# Patient Record
Sex: Male | Born: 1985 | Race: White | Hispanic: No | Marital: Married | Smoking: Current every day smoker
Health system: Southern US, Community
[De-identification: ages and names within clinical notes are randomized; demographics above are authoritative.]

## PROBLEM LIST (undated history)

## (undated) DIAGNOSIS — M542 Cervicalgia: Secondary | ICD-10-CM

## (undated) HISTORY — PX: NECK SURGERY: SHX720

## (undated) HISTORY — PX: FRACTURE SURGERY: SHX138

---

## 2004-09-01 ENCOUNTER — Emergency Department: Payer: Self-pay | Admitting: Emergency Medicine

## 2004-09-02 ENCOUNTER — Other Ambulatory Visit: Payer: Self-pay

## 2005-02-28 ENCOUNTER — Inpatient Hospital Stay: Payer: Self-pay | Admitting: Internal Medicine

## 2005-04-03 ENCOUNTER — Ambulatory Visit: Payer: Self-pay | Admitting: Emergency Medicine

## 2008-03-14 ENCOUNTER — Emergency Department: Payer: Self-pay | Admitting: Emergency Medicine

## 2009-06-04 ENCOUNTER — Inpatient Hospital Stay: Payer: Self-pay | Admitting: Surgery

## 2016-09-20 ENCOUNTER — Emergency Department
Admission: EM | Admit: 2016-09-20 | Discharge: 2016-09-20 | Disposition: A | Discharge status: Home / Self Care | Payer: Self-pay | Attending: Emergency Medicine | Admitting: Emergency Medicine

## 2016-09-20 ENCOUNTER — Encounter: Payer: Self-pay | Admitting: *Deleted

## 2016-09-20 DIAGNOSIS — Z79899 Other long term (current) drug therapy: Secondary | ICD-10-CM | POA: Insufficient documentation

## 2016-09-20 DIAGNOSIS — L0291 Cutaneous abscess, unspecified: Secondary | ICD-10-CM

## 2016-09-20 DIAGNOSIS — L0201 Cutaneous abscess of face: Secondary | ICD-10-CM | POA: Insufficient documentation

## 2016-09-20 MED ORDER — LIDOCAINE HCL (PF) 1 % IJ SOLN
INTRAMUSCULAR | Status: AC
  Filled 2016-09-20: qty 5

## 2016-09-20 MED ORDER — SULFAMETHOXAZOLE-TRIMETHOPRIM 800-160 MG PO TABS
1.0000 | ORAL_TABLET | Freq: Once | ORAL | Status: AC
  Administered 2016-09-20: 1 via ORAL
  Filled 2016-09-20: qty 1

## 2016-09-20 MED ORDER — SULFAMETHOXAZOLE-TRIMETHOPRIM 800-160 MG PO TABS: 1.0000 | ORAL_TABLET | Freq: Two times a day (BID) | ORAL | 0 refills | Status: AC

## 2016-09-20 NOTE — ED Notes (Signed)
See triage note   States he has had a "pimple" to right side of face for couple of months  Over the past couple of weeks area is more painful and swollen

## 2016-09-20 NOTE — ED Triage Notes (Signed)
States he had a pimple on the right side of his face that he picked at and now there is a large cyst and swollen area on the right side of his face

## 2016-09-20 NOTE — ED Provider Notes (Signed)
[facility] Emergency Department Provider Note  ____________________________________________  Time seen: Approximately 5:16 PM  I have reviewed the triage vital signs and the nursing notes.   HISTORY  Chief Complaint Facial Pain    HPI Calvin Wu is a 31 y.o. male that presents to the emergency department with swelling and "mass" on the right side of face. Patient states that he has gotten many pimples over the past couple of months on his right cheek, which he picks, which causes them to swell but eventually improve on their own. This one did not improve on its own and has continued to get bigger. Now the right side of his cheek is swelling. Patient states that it is draining clear and bloody fluid. Patient has not taken anything for symptoms. Denies fever, difficulty eating, difficulty swallowing, nausea, vomiting, abdominal pain.   History reviewed. No pertinent past medical history.  There are no active problems to display for this patient.   No past surgical history on file.  Prior to Admission medications   Medication Sig Start Date End Date Taking? Authorizing Provider  buprenorphine-naloxone (SUBOXONE) 8-2 MG SUBL SL tablet Place 1 tablet under the tongue daily.   Yes Historical Provider, MD  sulfamethoxazole-trimethoprim (BACTRIM DS,SEPTRA DS) 800-160 MG tablet Take 1 tablet by mouth 2 (two) times daily. 09/20/16 09/30/16  Enid DerryAshley Katlyn Muldrew, PA-C    Allergies Patient has no known allergies.  History reviewed. No pertinent family history.  Social History Social History  Substance Use Topics  . Smoking status: Not on file  . Smokeless tobacco: Not on file  . Alcohol use Not on file     Review of Systems  Constitutional: No fever/chills Eyes: No visual changes. No discharge. ENT: Negative for congestion and rhinorrhea. Cardiovascular: No chest pain. Respiratory: Negative for cough. No SOB. Gastrointestinal: No abdominal pain.  No nausea,  no vomiting.  No diarrhea.  No constipation. Musculoskeletal: Negative for musculoskeletal pain. Skin: Negative for abrasions, lacerations, ecchymosis. Neurological: Negative for headaches.   ____________________________________________   PHYSICAL EXAM:  VITAL SIGNS: ED Triage Vitals  Enc Vitals Group     BP 09/20/16 1602 119/77     Pulse Rate 09/20/16 1602 72     Resp 09/20/16 1602 18     Temp 09/20/16 1602 98.2 F (36.8 C)     Temp Source 09/20/16 1602 Oral     SpO2 09/20/16 1602 98 %     Weight 09/20/16 1603 195 lb (88.5 kg)     Height 09/20/16 1603 5\' 4"  (1.626 m)     Head Circumference --      Peak Flow --      Pain Score 09/20/16 1603 5     Pain Loc --      Pain Edu? --      Excl. in GC? --      Constitutional: Alert and oriented. Well appearing and in no acute distress. Eyes: Conjunctivae are normal. PERRL. EOMI. No discharge. Head: Atraumatic. ENT:      Ears: Tympanic membranes pearly gray with good landmarks. No discharge.      Nose: No congestion/rhinnorhea.      Mouth/Throat: Mucous membranes are moist. Oropharynx non-erythematous. Tonsils not enlarged. No exudates. Uvula midline. Neck: No stridor.   Hematological/Lymphatic/Immunilogical: No cervical lymphadenopathy. Cardiovascular: Normal rate, regular rhythm. Good peripheral circulation. Respiratory: Normal respiratory effort without tachypnea or retractions. Lungs CTAB. Good air entry to the bases with no decreased or absent breath sounds. Musculoskeletal: Full range of motion  to all extremities. No gross deformities appreciated. Neurologic:  Normal speech and language. No gross focal neurologic deficits are appreciated.  Skin:  Skin is warm, dry. Multiple pimples and scabs over right cheek. 1 cm area of fluctuance over lower right cheek. Tenderness to palpation. No drainage present.  Psychiatric: Mood and affect are normal. Speech and behavior are normal.     ____________________________________________   LABS (all labs ordered are listed, but only abnormal results are displayed)  Labs Reviewed - No data to display ____________________________________________  EKG   ____________________________________________  RADIOLOGY   No results found.  ____________________________________________    PROCEDURES  Procedure(s) performed:    Procedures  INCISION AND DRAINAGE Performed by: Enid Derry Consent: Verbal consent obtained. Risks and benefits: risks, benefits and alternatives were discussed Type: abscess  Body area: right cheek  Anesthesia: local infiltration  Incision was made with a scalpel.  Local anesthetic: lidocaine 1% without epinephrine  Anesthetic total: 2 ml  Complexity: complex Blunt dissection to break up loculations  Drainage: purulent  Drainage amount: 2 cc  Packing material: none  Patient tolerance: Patient tolerated the procedure well with no immediate complications.     Medications  sulfamethoxazole-trimethoprim (BACTRIM DS,SEPTRA DS) 800-160 MG per tablet 1 tablet (1 tablet Oral Given 09/20/16 1729)     ____________________________________________   INITIAL IMPRESSION / ASSESSMENT AND PLAN / ED COURSE  Pertinent labs & imaging results that were available during my care of the patient were reviewed by me and considered in my medical decision making (see chart for details).  Review of the [place] CSRS was performed in accordance of the [facility] prior to dispensing any controlled drugs.  Clinical Course     Patient's diagnosis is consistent with facial abscess. Abscess was I&D in ED. Patient will be discharged home with prescriptions for Bactrim. Patient is to follow up with PCP as needed or otherwise directed. Patient is given ED precautions to return to the ED for any worsening or new symptoms.     ____________________________________________  FINAL CLINICAL IMPRESSION(S) / ED  DIAGNOSES  Final diagnoses:  Abscess      NEW MEDICATIONS STARTED DURING THIS VISIT:  Discharge Medication List as of 09/20/2016  5:21 PM    START taking these medications   Details  sulfamethoxazole-trimethoprim (BACTRIM DS,SEPTRA DS) 800-160 MG tablet Take 1 tablet by mouth 2 (two) times daily., Starting Mon 09/20/2016, Until Thu 09/30/2016, Print            This chart was dictated using voice recognition software/Dragon. Despite best efforts to proofread, errors can occur which can change the meaning. Any change was purely unintentional.    Enid Derry, PA-C 09/21/16 1519    Sharman Cheek, MD 09/23/16 2009

## 2016-12-20 ENCOUNTER — Emergency Department: Payer: Self-pay

## 2016-12-20 ENCOUNTER — Encounter: Payer: Self-pay | Admitting: *Deleted

## 2016-12-20 ENCOUNTER — Inpatient Hospital Stay
Admission: EM | Admit: 2016-12-20 | Discharge: 2016-12-23 | DRG: 603 | Disposition: A | Discharge status: Home / Self Care | Payer: Self-pay | Attending: Specialist | Admitting: Specialist

## 2016-12-20 DIAGNOSIS — Z905 Acquired absence of kidney: Secondary | ICD-10-CM

## 2016-12-20 DIAGNOSIS — L02413 Cutaneous abscess of right upper limb: Secondary | ICD-10-CM

## 2016-12-20 DIAGNOSIS — Z85528 Personal history of other malignant neoplasm of kidney: Secondary | ICD-10-CM

## 2016-12-20 DIAGNOSIS — F1910 Other psychoactive substance abuse, uncomplicated: Secondary | ICD-10-CM | POA: Diagnosis present

## 2016-12-20 DIAGNOSIS — L0390 Cellulitis, unspecified: Secondary | ICD-10-CM | POA: Diagnosis present

## 2016-12-20 DIAGNOSIS — L03221 Cellulitis of neck: Secondary | ICD-10-CM | POA: Diagnosis present

## 2016-12-20 DIAGNOSIS — L03113 Cellulitis of right upper limb: Principal | ICD-10-CM | POA: Diagnosis present

## 2016-12-20 DIAGNOSIS — F17210 Nicotine dependence, cigarettes, uncomplicated: Secondary | ICD-10-CM | POA: Diagnosis present

## 2016-12-20 LAB — CBC
HCT: 43.2 % (ref 40.0–52.0)
Hemoglobin: 14.8 g/dL (ref 13.0–18.0)
MCH: 29.5 pg (ref 26.0–34.0)
MCHC: 34.2 g/dL (ref 32.0–36.0)
MCV: 86.4 fL (ref 80.0–100.0)
Platelets: 301 10*3/uL (ref 150–440)
RBC: 5.01 MIL/uL (ref 4.40–5.90)
RDW: 12.6 % (ref 11.5–14.5)
WBC: 14.8 10*3/uL — AB (ref 3.8–10.6)

## 2016-12-20 LAB — COMPREHENSIVE METABOLIC PANEL
ALBUMIN: 4 g/dL (ref 3.5–5.0)
ALT: 25 U/L (ref 17–63)
AST: 28 U/L (ref 15–41)
Alkaline Phosphatase: 76 U/L (ref 38–126)
Anion gap: 9 (ref 5–15)
BUN: 14 mg/dL (ref 6–20)
CHLORIDE: 99 mmol/L — AB (ref 101–111)
CO2: 27 mmol/L (ref 22–32)
Calcium: 9.2 mg/dL (ref 8.9–10.3)
Creatinine, Ser: 0.91 mg/dL (ref 0.61–1.24)
GFR calc Af Amer: >60 mL/min (ref >60)
GLUCOSE: 95 mg/dL (ref 65–99)
POTASSIUM: 3.9 mmol/L (ref 3.5–5.1)
SODIUM: 135 mmol/L (ref 135–145)
Total Bilirubin: 0.6 mg/dL (ref 0.3–1.2)
Total Protein: 8 g/dL (ref 6.5–8.1)

## 2016-12-20 LAB — LACTIC ACID, PLASMA: LACTIC ACID, VENOUS: 0.8 mmol/L (ref 0.5–1.9)

## 2016-12-20 MED ORDER — SENNOSIDES-DOCUSATE SODIUM 8.6-50 MG PO TABS: 1.0000 | ORAL_TABLET | Freq: Every evening | ORAL | Status: DC | PRN

## 2016-12-20 MED ORDER — VANCOMYCIN HCL 10 G IV SOLR
1250.0000 mg | Freq: Two times a day (BID) | INTRAVENOUS | Status: DC
  Administered 2016-12-20 – 2016-12-22 (×4): 1250 mg via INTRAVENOUS
  Filled 2016-12-20 (×7): qty 1250

## 2016-12-20 MED ORDER — ONDANSETRON HCL 4 MG PO TABS: 4.0000 mg | ORAL_TABLET | Freq: Four times a day (QID) | ORAL | Status: DC | PRN

## 2016-12-20 MED ORDER — VANCOMYCIN HCL 10 G IV SOLR: 1250.0000 mg | Freq: Two times a day (BID) | INTRAVENOUS | Status: DC

## 2016-12-20 MED ORDER — SODIUM CHLORIDE 0.9 % IV SOLN
INTRAVENOUS | Status: DC
  Administered 2016-12-20 – 2016-12-21 (×2): via INTRAVENOUS

## 2016-12-20 MED ORDER — ENOXAPARIN SODIUM 40 MG/0.4ML ~~LOC~~ SOLN
40.0000 mg | SUBCUTANEOUS | Status: DC
  Administered 2016-12-21 – 2016-12-22 (×3): 40 mg via SUBCUTANEOUS
  Filled 2016-12-20 (×4): qty 0.4

## 2016-12-20 MED ORDER — NICOTINE 21 MG/24HR TD PT24
21.0000 mg | MEDICATED_PATCH | Freq: Every day | TRANSDERMAL | Status: DC
  Administered 2016-12-20 – 2016-12-23 (×4): 21 mg via TRANSDERMAL
  Filled 2016-12-20 (×4): qty 1

## 2016-12-20 MED ORDER — VANCOMYCIN HCL IN DEXTROSE 1-5 GM/200ML-% IV SOLN
1000.0000 mg | Freq: Once | INTRAVENOUS | Status: AC
  Administered 2016-12-20: 1000 mg via INTRAVENOUS
  Filled 2016-12-20: qty 200

## 2016-12-20 MED ORDER — BISACODYL 5 MG PO TBEC: 5.0000 mg | DELAYED_RELEASE_TABLET | Freq: Every day | ORAL | Status: DC | PRN

## 2016-12-20 MED ORDER — HYDROCODONE-ACETAMINOPHEN 5-325 MG PO TABS
1.0000 | ORAL_TABLET | ORAL | Status: DC | PRN
  Administered 2016-12-20 – 2016-12-21 (×5): 2 via ORAL
  Filled 2016-12-20 (×6): qty 2

## 2016-12-20 MED ORDER — BUPRENORPHINE HCL-NALOXONE HCL 8-2 MG SL SUBL
1.0000 | SUBLINGUAL_TABLET | Freq: Two times a day (BID) | SUBLINGUAL | Status: DC
  Administered 2016-12-20 – 2016-12-23 (×6): 1 via SUBLINGUAL
  Filled 2016-12-20 (×6): qty 1

## 2016-12-20 MED ORDER — ACETAMINOPHEN 650 MG RE SUPP: 650.0000 mg | Freq: Four times a day (QID) | RECTAL | Status: DC | PRN

## 2016-12-20 MED ORDER — BUPRENORPHINE HCL-NALOXONE HCL 8-2 MG SL SUBL: 1.0000 | SUBLINGUAL_TABLET | Freq: Once | SUBLINGUAL | Status: DC

## 2016-12-20 MED ORDER — ACETAMINOPHEN 325 MG PO TABS: 650.0000 mg | ORAL_TABLET | Freq: Four times a day (QID) | ORAL | Status: DC | PRN

## 2016-12-20 MED ORDER — BUPRENORPHINE HCL 2 MG SL SUBL
2.0000 mg | SUBLINGUAL_TABLET | Freq: Every day | SUBLINGUAL | Status: DC
  Administered 2016-12-20: 2 mg via SUBLINGUAL
  Filled 2016-12-20: qty 1

## 2016-12-20 MED ORDER — ONDANSETRON HCL 4 MG/2ML IJ SOLN: 4.0000 mg | Freq: Four times a day (QID) | INTRAMUSCULAR | Status: DC | PRN

## 2016-12-20 NOTE — Consult Note (Signed)
Date of Consultation:  12/20/2016  Requesting Physician:  Jene Every, MD  Reason for Consultation:  Right upper extremity cellulitis  History of Present Illness: Calvin Wu is a 31 y.o. male who presents with a 2 day history of worsening pain and swelling over the right AC fossa.  Patient reports that he used cocaine and meth 2 days ago using his right before meals fossa for injection as well as right neck and since then he started developing worsening redness swelling and pain particularly over the right before meals fossa and only slightly over the right lower neck. She denies having any fevers at home or chills. Denies any drainage from either area. Denies any chest pain or shortness of breath. He reports decreased range of motion at the right elbow joint due to the pain and swelling but otherwise no decreased range of motion over the right neck or shoulder. He presents today to emergency room for further evaluation.  Past Medical History: --IV drug abuse --Prior traumas in 2006, 2007 --Left renal cell carcinoma  Past Surgical History: --Appendectomy --Left partial nephrectomy  Home Medications: Prior to Admission medications   Medication Sig Start Date End Date Taking? Authorizing Provider  buprenorphine-naloxone (SUBOXONE) 8-2 MG SUBL SL tablet Place 1 tablet under the tongue daily.    Historical Provider, MD    Allergies: No Known Allergies  Social History:  reports that he smokes 1 ppd.  He has never used smokeless tobacco. He reports that he drinks alcohol. He reports that he uses drugs, including Cocaine, IV, and Methamphetamines.   Family History: HTN on father's side  Review of Systems: Review of Systems  Constitutional: Negative for chills and fever.  HENT: Negative for hearing loss.   Eyes: Negative for blurred vision.  Respiratory: Negative for cough and shortness of breath.   Cardiovascular: Negative for chest pain and leg swelling.  Gastrointestinal:  Negative for abdominal pain, nausea and vomiting.  Genitourinary: Negative for dysuria.  Musculoskeletal:       Swelling and tenderness of the right elbow area  Skin: Negative for rash.  Neurological: Negative for dizziness.  Psychiatric/Behavioral: Negative for depression.    Physical Exam BP 133/84 (BP Location: Left Arm)   Pulse 73   Temp 99.1 F (37.3 C) (Oral)   Resp 18   Ht  (1.626 m)   Wt 86.2 kg (190 lb)   SpO2 94%   BMI 32.61 kg/m  CONSTITUTIONAL: No acute distress HEENT:  Normocephalic, atraumatic, extraocular motion intact. NECK: Trachea is midline, and there is no jugular venous distension. RESPIRATORY:  Lungs are clear, and breath sounds are equal bilaterally. Normal respiratory effort without pathologic use of accessory muscles. CARDIOVASCULAR: Heart is regular without murmurs, gallops, or rubs. GI: The abdomen is soft, non-distended, non-tender to palpation. Low midline incision well healed. MUSCULOSKELETAL:  Right upper extremity with decreased range of motion at the elbow joint due to pain and swelling.  Otherwise normal strength and range of motion throughout. SKIN:  Right AC fossa with mild erythema and induration, with no palpable area of fluctuance.  AC fossa area is tender to palpation.  No active drainage.   NEUROLOGIC:  Motor and sensation is grossly normal.  Cranial nerves are grossly intact. PSYCH:  Alert and oriented to person, place and time. Affect is normal.  Laboratory Analysis: Results for orders placed or performed during the hospital encounter of 12/20/16 (from the past 24 hour(s))  CBC     Status: Abnormal   Collection  Time: 12/20/16 10:44 AM  Result Value Ref Range   WBC 14.8 (H) 3.8 - 10.6 K/uL   RBC 5.01 4.40 - 5.90 MIL/uL   Hemoglobin 14.8 13.0 - 18.0 g/dL   HCT 09.8 11.9 - 14.7 %   MCV 86.4 80.0 - 100.0 fL   MCH 29.5 26.0 - 34.0 pg   MCHC 34.2 32.0 - 36.0 g/dL   RDW 82.9 56.2 - 13.0 %   Platelets 301 150 - 440 K/uL   Comprehensive metabolic panel     Status: Abnormal   Collection Time: 12/20/16 10:44 AM  Result Value Ref Range   Sodium 135 135 - 145 mmol/L   Potassium 3.9 3.5 - 5.1 mmol/L   Chloride 99 (L) 101 - 111 mmol/L   CO2 27 22 - 32 mmol/L   Glucose, Bld 95 65 - 99 mg/dL   BUN 14 6 - 20 mg/dL   Creatinine, Ser 8.65 0.61 - 1.24 mg/dL   Calcium 9.2 8.9 - 78.4 mg/dL   Total Protein 8.0 6.5 - 8.1 g/dL   Albumin 4.0 3.5 - 5.0 g/dL   AST 28 15 - 41 U/L   ALT 25 17 - 63 U/L   Alkaline Phosphatase 76 38 - 126 U/L   Total Bilirubin 0.6 0.3 - 1.2 mg/dL   GFR calc non Af Amer >60 >60 mL/min   GFR calc Af Amer >60 >60 mL/min   Anion gap 9 5 - 15  Lactic acid, plasma     Status: None   Collection Time: 12/20/16 10:44 AM  Result Value Ref Range   Lactic Acid, Venous 0.8 0.5 - 1.9 mmol/L    Imaging: [place] Extrem Up Right Ltd  Result Date: 12/20/2016 CLINICAL DATA:  IV drug use, antecubital abscess, redness, swelling, painful for 2 days EXAM: ULTRASOUND RIGHT UPPER EXTREMITY LIMITED TECHNIQUE: Ultrasound examination of the upper extremity soft tissues was performed in the area of clinical concern. COMPARISON:  None FINDINGS: 4 x 3.1 x 2.8 cm complex heterogeneous soft tissue abnormality with increased Doppler flow in the antecubital fossa most concerning for a phlegmon or cellulitis. Heterogeneous hypoechoic avascular area located centrally within the area of hypervascularity measuring 9 x 7 mm concerning for a small abscess. No other fluid collection or hematoma. IMPRESSION: 1. 4 x 3.1 x 2.8 cm complex heterogeneous soft tissue abnormality with increased Doppler flow in the antecubital fossa most concerning for a phlegmon or cellulitis. Heterogeneous hypoechoic avascular area located centrally within the area of hypervascularity measuring 9 x 7 mm concerning for a small abscess. Electronically Signed   By: Elige Ko   On: 12/20/2016 10:58    Assessment and Plan: This is a 31 y.o. male who presents with  right AC fossa cellulitis, with a very small abscess in the center of the cellulitis area.  I have personally viewed the patient's imaging study as well as reviewed the patient's laboratory studies and discussed them with the patient. Overall he has an area of cellulitis and induration in the right before meals fossa with a very small abscess measuring 9 x 7 mm. His white blood cell count is 14.8 but otherwise rest of his labs are overall normal.  Discussed with the patient that currently the best treatment option would be to start with IV antibiotics to treat the cellulitis and induration in that this point given the abscess is very small, most likely IV antibiotics will be able to treat it. There is a chance that the induration consolidates into  a bigger abscess in which case he would require drainage procedure. The mild erythema around the right lower neck is small enough that this will also resolve with the IV antibiotics. At this point this is nonoperative management.  Recommend IV antibiotics otherwise can have a regular diet and appropriate pain control. We'll continue following along. The area of erythema has been marked around the [facility] fossa.   Howie Ill, MD [facility]

## 2016-12-20 NOTE — ED Notes (Signed)
Admitting MD came out and found this Rn stating that pt appears red in the face after vancomycin administration. Antibiotic was finished. Pt alert oriented, no distress noted, appears red on cheeks. Will continue to monitor.

## 2016-12-20 NOTE — ED Notes (Signed)
Patient transported to Ultrasound prior to this RN assessment.

## 2016-12-20 NOTE — ED Triage Notes (Signed)
States right arm swelling that began 2 days ago, states he is an IV drug user and shot up in his right arm two days ago, also states right neck pain and swelling, states he shots up in his neck as well

## 2016-12-20 NOTE — ED Notes (Signed)
Pt asleep on stretcher, hooked up to monitor and IV antibiotics.

## 2016-12-20 NOTE — H&P (Signed)
[facility] [place] at [facility]   PATIENT NAME: Calvin Wu    MR#:  [id number]  DATE OF BIRTH:  [birth year]  DATE OF ADMISSION:  12/20/2016  PRIMARY CARE PHYSICIAN: No PCP Per Patient   REQUESTING/REFERRING PHYSICIAN: dr Cyril Loosen  CHIEF COMPLAINT:   Right arm pain HISTORY OF PRESENT ILLNESS:  Calvin Wu  is a 31 y.o. male with a known history of IV drug abuse and tobacco dependence who presents above complaint. Patient reports that he injected IV cocaine 2 days ago into the right anterior cubital fossa. She shortly after that he noticed redness and pain of the right arm. He presents today because the redness and pain has increased. He also states he injected IV cocaine in his neck and he has some pain in the right neck as well.  He received vancomycin in the emergency room and when I went to see him patient's face was flushed. By that time vancomycin was already completed.  Surgical consultation was requested by ED physician. At this time patient does not have an abscess to be drained as per surgery consultation.  PAST MEDICAL HISTORY:  IV drug abuse Tobacco abuse  PAST SURGICAL HISTORY:  None  SOCIAL HISTORY:   Social History  Substance Use Topics  . Smoking status: Current Every Day Smoker  . Smokeless tobacco: Never Used  . Alcohol use Yes    FAMILY HISTORY:  No history of CAD or CVA   DRUG ALLERGIES:  No Known Allergies  REVIEW OF SYSTEMS:   Review of Systems  Constitutional: Negative.  Negative for chills, fever and malaise/fatigue.  HENT: Negative.  Negative for ear discharge, ear pain, hearing loss, nosebleeds and sore throat.   Eyes: Negative.  Negative for blurred vision and pain.  Respiratory: Negative.  Negative for cough, hemoptysis, shortness of breath and wheezing.   Cardiovascular: Negative.  Negative for chest pain, palpitations and leg swelling.  Gastrointestinal: Negative.  Negative for abdominal pain, blood in stool,  diarrhea, nausea and vomiting.  Genitourinary: Negative.  Negative for dysuria.  Musculoskeletal: Negative.  Negative for back pain.       Right arm pain and swelling  Skin: Negative.        Right arm cellulitis Neck pain and redness  Neurological: Negative for dizziness, tremors, speech change, focal weakness, seizures and headaches.  Endo/Heme/Allergies: Negative.  Does not bruise/bleed easily.  Psychiatric/Behavioral: Negative.  Negative for depression, hallucinations and suicidal ideas.    MEDICATIONS AT HOME:   Prior to Admission medications   Medication Sig Start Date End Date Taking? Authorizing Provider  SUBOXONE 8-2 MG FILM Place 1 Film under the tongue 2 (two) times daily. 12/19/16  Yes Historical Provider, MD      VITAL SIGNS:  Blood pressure (!) 139/92, pulse 81, temperature 99.1 F (37.3 C), temperature source Oral, resp. rate 18, height  (1.626 m), weight 86.2 kg (190 lb), SpO2 94 %.  PHYSICAL EXAMINATION:   Physical Exam  Constitutional: He is oriented to person, place, and time and well-developed, well-nourished, and in no distress. No distress.  HENT:  Head: Normocephalic.  Eyes: No scleral icterus.  Neck: Normal range of motion. Neck supple. No JVD present. No tracheal deviation present.  Cardiovascular: Normal rate, regular rhythm and normal heart sounds.  Exam reveals no gallop and no friction rub.   No murmur heard. Pulmonary/Chest: Effort normal and breath sounds normal. No respiratory distress. He has no wheezes. He has no rales. He exhibits no tenderness.  Abdominal: Soft. Bowel sounds are normal. He exhibits no distension and no mass. There is no tenderness. There is no rebound and no guarding.  Musculoskeletal: Normal range of motion. He exhibits no edema.  Neurological: He is alert and oriented to person, place, and time.  Skin: Skin is warm. No rash noted. No erythema.  Face is flushed Multiple tattoos Cellulitis on neck and right arm   Psychiatric: Affect and judgment normal.      LABORATORY PANEL:   CBC  Recent Labs Lab 12/20/16 1044  WBC 14.8*  HGB 14.8  HCT 43.2  PLT 301   ------------------------------------------------------------------------------------------------------------------  Chemistries   Recent Labs Lab 12/20/16 1044  NA 135  K 3.9  CL 99*  CO2 27  GLUCOSE 95  BUN 14  CREATININE 0.91  CALCIUM 9.2  AST 28  ALT 25  ALKPHOS 76  BILITOT 0.6   ------------------------------------------------------------------------------------------------------------------  Cardiac Enzymes No results for input(s): TROPONINI in the last 168 hours. ------------------------------------------------------------------------------------------------------------------  RADIOLOGY:  [place] Extrem Up Right Ltd  Result Date: 12/20/2016 CLINICAL DATA:  IV drug use, antecubital abscess, redness, swelling, painful for 2 days EXAM: ULTRASOUND RIGHT UPPER EXTREMITY LIMITED TECHNIQUE: Ultrasound examination of the upper extremity soft tissues was performed in the area of clinical concern. COMPARISON:  None FINDINGS: 4 x 3.1 x 2.8 cm complex heterogeneous soft tissue abnormality with increased Doppler flow in the antecubital fossa most concerning for a phlegmon or cellulitis. Heterogeneous hypoechoic avascular area located centrally within the area of hypervascularity measuring 9 x 7 mm concerning for a small abscess. No other fluid collection or hematoma. IMPRESSION: 1. 4 x 3.1 x 2.8 cm complex heterogeneous soft tissue abnormality with increased Doppler flow in the antecubital fossa most concerning for a phlegmon or cellulitis. Heterogeneous hypoechoic avascular area located centrally within the area of hypervascularity measuring 9 x 7 mm concerning for a small abscess. Electronically Signed   By: Elige Ko   On: 12/20/2016 10:58    EKG:   none  IMPRESSION AND PLAN:   31 year old male with a history of IV drug abuse  who presents with right arm and right neck cellulitis  1. Right arm and neck cellulitis without abscess at this time Continue vancomycin at a slower rate. Follow up on blood cultures Surgery consultation to follow  2. IV drug abuse: Patient was counseled to stop illicit drugs.  3. Tobacco dependence: Patient is encouraged to quit smoking. Counseling was provided for 4 minutes.  4. Leukocytosis due to problem #1 CBC in a.m.   All the records are reviewed and case discussed with ED provider. Management plans discussed with the patient and he is in agreement  CODE STATUS: full  TOTAL TIME TAKING CARE OF THIS PATIENT: 40 minutes.    Carvel Huskins M.D on 12/20/2016 at 1:49 PM  Between 7am to 6pm - Pager - [phone]  After 6pm go to [email or web address] - Social research officer, government  [facility]  Office  [phone]  CC: Primary care physician; No PCP Per Patient   '

## 2016-12-20 NOTE — Progress Notes (Addendum)
Pharmacy Antibiotic Note  Calvin Wu is a 31 y.o. male admitted on 12/20/2016 with cellulitis.  Pharmacy has been consulted for vancomycin dosing.  Plan: Given history of IVDU several days prior to admission, will target higher vancomycin trough of 15-20 mcg/mL until bacteremia is ruled out.  Patient received vancomycin 1000 mg dose in ED. Will follow with vancomycin 1250 mg IV q12h (6 hour stacked dosing). VT ordered for 4/18 at 1730 which is prior to 5th dose and should represent steady state.  Kinetics: Using adjusted body weight = 69 kg ke: 0.087 Half-life: 8 hours Vd: 48 L Cmin (estimated) ~17 mcg/mL  Height:  (162.6 cm) Weight: 190 lb (86.2 kg) IBW/kg (Calculated) : 59.2  Temp (24hrs), Avg:99.1 F (37.3 C), Min:99.1 F (37.3 C), Max:99.1 F (37.3 C)   Recent Labs Lab 12/20/16 1044  WBC 14.8*  CREATININE 0.91  LATICACIDVEN 0.8    Estimated Creatinine Clearance: 116.5 mL/min (by C-G formula based on SCr of 0.91 mg/dL).    No Known Allergies  Antimicrobials this admission: vancomycin 4/16 >>   Dose adjustments this admission:  Microbiology results: 4/16 BCx: Sent  Thank you for allowing pharmacy to be a part of this patient's care.  Cindi Carbon, PharmD, BCPS Clinical Pharmacist 12/20/2016 2:41 PM  Addendum:  Have doubled infusion duration for vancomycin dose to be given over 180 minutes per consult recommendation given patient experienced red man syndrome in ED.  Cindi Carbon, PharmD, BCPS 12/20/16 2:46 PM

## 2016-12-20 NOTE — Progress Notes (Signed)
Patient ID: Calvin Wu, male   DOB: 08/02/1986, 31 y.o.   MRN: [id number]   Confirmed with pharmacy via registry that patient is taking Suboxone 8-2mg  SL BID.  Will restart at his home dose and discontinue Subutex.

## 2016-12-20 NOTE — ED Provider Notes (Signed)
[facility] Emergency Department Provider Note   ____________________________________________    I have reviewed the triage vital signs and the nursing notes.   HISTORY  Chief Complaint Arm Swelling     HPI Calvin Wu is a 31 y.o. male Who presents with complaints of severeright arm pain. Reports a history of IV drug abuse, he has recently been injecting in his right antecubital fossa. Over the last several days he has developed worsening pain and redness. He is unsure if he has had a fever. No diaphoresis or chest pain. No other areas of redness reported.   History reviewed. No pertinent past medical history.  Patient Active Problem List   Diagnosis Date Noted  . Cellulitis 12/20/2016  . Right arm cellulitis     History reviewed. No pertinent surgical history.  Prior to Admission medications   Medication Sig Start Date End Date Taking? Authorizing Provider  SUBOXONE 8-2 MG FILM Place 1 Film under the tongue 2 (two) times daily. 12/19/16  Yes Historical Provider, MD     Allergies Patient has no known allergies.  History reviewed. No pertinent family history.  Social History Social History  Substance Use Topics  . Smoking status: Current Every Day Smoker  . Smokeless tobacco: Never Used  . Alcohol use Yes    Review of Systems  Constitutional: No fever/chills Eyes: No visual changes.  ENT: No sore throat. Cardiovascular: Denies chest pain. Respiratory: Denies shortness of breath. Gastrointestinal: No nausea, no vomiting.    Musculoskeletal: right arm pain Skin: positive redness Neurological: Negative for headaches or weakness  10-point ROS otherwise negative.  ____________________________________________   PHYSICAL EXAM:  VITAL SIGNS: ED Triage Vitals  Enc Vitals Group     BP 12/20/16 0947 123/78     Pulse Rate 12/20/16 0947 97     Resp 12/20/16 0947 16     Temp 12/20/16 0947 99.1 F (37.3 C)     Temp Source  12/20/16 0947 Oral     SpO2 12/20/16 0947 96 %     Weight 12/20/16 0948 190 lb (86.2 kg)     Height 12/20/16 0948  (1.626 m)     Head Circumference --      Peak Flow --      Pain Score 12/20/16 0954 10     Pain Loc --      Pain Edu? --      Excl. in GC? --     Constitutional: Alert and oriented. No acute distress.  Eyes: Conjunctivae are normal.   Nose: No congestion/rhinnorhea. Mouth/Throat: Mucous membranes are moist.    Cardiovascular: Normal rate, regular rhythm. Grossly normal heart sounds.  Good peripheral circulation. Respiratory: Normal respiratory effort.  No retractions. Lungs CTAB. Gastrointestinal: Soft and nontender. No distention.  No CVA tenderness. Genitourinary: deferred Musculoskeletal: Right arm: AC area of induration and swelling with redness. Small amount of fluctuance, consistent with abscess. Warm and well perfused Neurologic:  Normal speech and language. No gross focal neurologic deficits are appreciated.  Skin:  Skin is warm, dry and intact Psychiatric: Mood and affect are normal. Speech and behavior are normal.  ____________________________________________   LABS (all labs ordered are listed, but only abnormal results are displayed)  Labs Reviewed  CBC - Abnormal; Notable for the following:       Result Value   WBC 14.8 (*)    All other components within normal limits  COMPREHENSIVE METABOLIC PANEL - Abnormal; Notable for the following:  Chloride 99 (*)    All other components within normal limits  CULTURE, BLOOD (ROUTINE X 2)  CULTURE, BLOOD (ROUTINE X 2)  LACTIC ACID, PLASMA  LACTIC ACID, PLASMA   ____________________________________________  EKG   ____________________________________________  RADIOLOGY  Ultrasound demonstrates area of cellulitis and possible abscess ____________________________________________   PROCEDURES  Procedure(s) performed: No    Critical Care performed:  No ____________________________________________   INITIAL IMPRESSION / ASSESSMENT AND PLAN / ED COURSE  Pertinent labs & imaging results that were available during my care of the patient were reviewed by me and considered in my medical decision making (see chart for details).  Patient presents with likely cellulitis/abscess. We will obtain labs, ultrasound and give IV antibiotics   Ultrasound confirms a likely small abscess, consulted surgery for evaluation  Dr. Aleen Campi feels no I&D necessary at this time. Will admit to medicine for IV abx    ____________________________________________   FINAL CLINICAL IMPRESSION(S) / ED DIAGNOSES  Final diagnoses:  Abscess of arm, right  Cellulitis of arm, right      NEW MEDICATIONS STARTED DURING THIS VISIT:  New Prescriptions   No medications on file     Note:  This document was prepared using Dragon voice recognition software and may include unintentional dictation errors.    Jene Every, MD 12/20/16 [phone]

## 2016-12-21 LAB — MRSA PCR SCREENING: MRSA by PCR: NEGATIVE

## 2016-12-21 LAB — CBC
HEMATOCRIT: 41.7 % (ref 40.0–52.0)
Hemoglobin: 14.3 g/dL (ref 13.0–18.0)
MCH: 29.7 pg (ref 26.0–34.0)
MCHC: 34.4 g/dL (ref 32.0–36.0)
MCV: 86.3 fL (ref 80.0–100.0)
PLATELETS: 294 10*3/uL (ref 150–440)
RBC: 4.83 MIL/uL (ref 4.40–5.90)
RDW: 12.9 % (ref 11.5–14.5)
WBC: 12.5 10*3/uL — ABNORMAL HIGH (ref 3.8–10.6)

## 2016-12-21 LAB — CREATININE, SERUM
Creatinine, Ser: 0.71 mg/dL (ref 0.61–1.24)
GFR calc Af Amer: >60 mL/min (ref >60)
GFR calc non Af Amer: >60 mL/min (ref >60)

## 2016-12-21 MED ORDER — OXYCODONE HCL 5 MG PO TABS
10.0000 mg | ORAL_TABLET | ORAL | Status: DC | PRN
  Administered 2016-12-21 – 2016-12-23 (×9): 10 mg via ORAL
  Filled 2016-12-21 (×9): qty 2

## 2016-12-21 MED ORDER — OXYCODONE HCL 5 MG PO TABS: 5.0000 mg | ORAL_TABLET | ORAL | Status: DC | PRN

## 2016-12-21 NOTE — Progress Notes (Signed)
Patient states Vicodin not relieving his pain 10/10. Orders received for oxycodone 5-10 mg every 4 hours as needed for pain

## 2016-12-21 NOTE — Progress Notes (Signed)
IV access clamped by patient that resulted in occlusion and removal. IV consult for placement. Vanc on hold until new access.

## 2016-12-21 NOTE — Progress Notes (Signed)
12/21/2016  Subjective: No acute events overnight. Patient admitted yesterday with cellulitis to the right AC fossa. Reports that his pain has improved and his range of motion of the right elbow has improved as well.  Vital signs: Temp:  [98.2 F (36.8 C)-99.1 F (37.3 C)] 98.2 F (36.8 C) (04/17 0749) Pulse Rate:  [62-97] 62 (04/17 0749) Resp:  [16-18] 16 (04/17 0749) BP: (116-139)/(72-92) 116/80 (04/17 0749) SpO2:  [94 %-99 %] 99 % (04/17 0749) Weight:  [86.2 kg (190 lb)] 86.2 kg (190 lb) (04/16 0948)   Intake/Output: 04/16 0701 - 04/17 0700 In: 1878.3 [P.O.:360; I.V.:1268.3; IV Piggyback:250] Out: 400 [Urine:400] Last BM Date: 12/18/16  Physical Exam: Constitutional: No acute distress Skin: Improved erythema over the before meals fossa with less induration and tenderness. No areas of fluctuance noted. Improve range of motion.  Labs:   Recent Labs  12/20/16 1044 12/21/16 0419  WBC 14.8* 12.5*  HGB 14.8 14.3  HCT 43.2 41.7  PLT 301 294    Recent Labs  12/20/16 1044 12/21/16 0419  NA 135  --   K 3.9  --   CL 99*  --   CO2 27  --   GLUCOSE 95  --   BUN 14  --   CREATININE 0.91 0.71  CALCIUM 9.2  --    No results for input(s): LABPROT, INR in the last 72 hours.  Imaging: [place] Extrem Up Right Ltd  Result Date: 12/20/2016 CLINICAL DATA:  IV drug use, antecubital abscess, redness, swelling, painful for 2 days EXAM: ULTRASOUND RIGHT UPPER EXTREMITY LIMITED TECHNIQUE: Ultrasound examination of the upper extremity soft tissues was performed in the area of clinical concern. COMPARISON:  None FINDINGS: 4 x 3.1 x 2.8 cm complex heterogeneous soft tissue abnormality with increased Doppler flow in the antecubital fossa most concerning for a phlegmon or cellulitis. Heterogeneous hypoechoic avascular area located centrally within the area of hypervascularity measuring 9 x 7 mm concerning for a small abscess. No other fluid collection or hematoma. IMPRESSION: 1. 4 x 3.1 x 2.8 cm  complex heterogeneous soft tissue abnormality with increased Doppler flow in the antecubital fossa most concerning for a phlegmon or cellulitis. Heterogeneous hypoechoic avascular area located centrally within the area of hypervascularity measuring 9 x 7 mm concerning for a small abscess. Electronically Signed   By: Elige Ko   On: 12/20/2016 10:58    Assessment/Plan: 31 year old male with cellulitis to the right upper extremity.  -Continue IV antibiotics for now. Patient's white blood cell count has improved as well as his cellulitis. Would recommend another 24 hours of IV antibiotics. -We'll continue follow along with you. Currently no need for any surgical drainage.   Howie Ill, MD [facility]

## 2016-12-21 NOTE — Clinical Social Work Note (Addendum)
Clinical Social Work Assessment  Patient Details  Name: Calvin Wu MRN: [id number] Date of Birth: Jun 06, 1986  Date of referral:  12/21/16               Reason for consult:  Substance Use/ETOH Abuse                Permission sought to share information with:    Permission granted to share information::     Name::        Agency::     Relationship::     Contact Information:     Housing/Transportation Living arrangements for the past 2 months:  Single Family Home Source of Information:  Patient Patient Interpreter Needed:  None Criminal Activity/Legal Involvement Pertinent to Current Situation/Hospitalization:  No - Comment as needed Significant Relationships:  Significant Other Lives with:  Significant Other Do you feel safe going back to the place where you live?  Yes Need for family participation in patient care:  Yes (Comment)  Care giving concerns:  Patient lives with his Psychiatric nurse in [place].    Social Worker assessment / plan:  Social work Systems developer consult from RN in progression rounds that patient has a history of IV drug use. Social work Theatre manager met with patient and Brewing technologist at bedside. Patient and fiance were laying in bed watching TV. Patient was alert and oriented x4. Social work Theatre manager introduced self and explained role of social work department. Per patient, he lives in [place] with his Psychiatric nurse. Patient does not have any children, but fiance Amber has one son that patient sees often. Patient does not currently have a job. Patient stated that he relapsed and used cocaine about 2 weeks ago. Patient said that he does not use a lot of cocaine and has been using off and on for years. Patient stated that he only uses cocaine and does not drink alcohol. Patient also stated that he is currently receiving treatment at [facility] in [place]. Patient goes two times a week to [place]. Patient also stated he has been to [place] in [place] for treatment before going to [place]. Social work Theatre manager asked if fiance's son was present during the time of drug use. Patient stated that the child was not and is in kinship care. Patient's fiance stated that DSS is already involved. Social work Theatre manager provided emotional support and offered additional resources in [place]. Social work Theatre manager will continue to assist and follow as needed.   Employment status:  Unemployed Forensic scientist:  Self Pay (Medicaid Pending) PT Recommendations:  Not assessed at this time Information / Referral to community resources:  Outpatient Substance Abuse Treatment Options  Patient/Family's Response to care:  Patient stated that he will continue to go to [facility] in [place] for treatment.   Patient/Family's Understanding of and Emotional Response to Diagnosis, Current Treatment, and Prognosis:  Patient and patient's fiance was pleasant and thanked social work Theatre manager for visiting.   Emotional Assessment Appearance:  Appears stated age Attitude/Demeanor/Rapport:    Affect (typically observed):  Accepting, Adaptable, Appropriate Orientation:  Oriented to Self, Oriented to Place, Oriented to Situation, Oriented to  Time Alcohol / Substance use:  Not Applicable Psych involvement (Current and /or in the community):  No (Comment)  Discharge Needs  Concerns to be addressed:  Basic Needs Readmission within the last 30 days:  No Current discharge risk:  Substance Abuse Barriers to Discharge:  Continued Medical Work up  Danie Chandler, Student-Social Work 12/21/2016, 10:01 AM

## 2016-12-22 LAB — HIV ANTIBODY (ROUTINE TESTING W REFLEX): HIV SCREEN 4TH GENERATION: NONREACTIVE

## 2016-12-22 LAB — CBC
HCT: 39 % — ABNORMAL LOW (ref 40.0–52.0)
HEMOGLOBIN: 13.6 g/dL (ref 13.0–18.0)
MCH: 30.3 pg (ref 26.0–34.0)
MCHC: 34.8 g/dL (ref 32.0–36.0)
MCV: 87.1 fL (ref 80.0–100.0)
Platelets: 288 10*3/uL (ref 150–440)
RBC: 4.47 MIL/uL (ref 4.40–5.90)
RDW: 12.6 % (ref 11.5–14.5)
WBC: 10.8 10*3/uL — ABNORMAL HIGH (ref 3.8–10.6)

## 2016-12-22 LAB — VANCOMYCIN, TROUGH

## 2016-12-22 NOTE — Progress Notes (Signed)
12/22/2016  Subjective: No acute events overnight.  WBC trending down slowly.  Patient required more pain medication overnight.  Vital signs: Temp:  [98.3 F (36.8 C)] 98.3 F (36.8 C) (04/17 1438) Pulse Rate:  [73] 73 (04/17 1438) BP: (133)/(69) 133/69 (04/17 1438) SpO2:  [99 %] 99 % (04/17 1438)   Intake/Output: 04/17 0701 - 04/18 0700 In: 120 [P.O.:120] Out: 300 [Urine:300] Last BM Date: 12/18/16  Physical Exam: Constitutional: No acute distress Extremities:  Right arm with improved erythema and induration.  There is still some residual hardened area in the middle of the [facility] fossa, but otherwise the induration and erythema are improving.  Labs:   Recent Labs  12/21/16 0419 12/22/16 0510  WBC 12.5* 10.8*  HGB 14.3 13.6  HCT 41.7 39.0*  PLT 294 288    Recent Labs  12/20/16 1044 12/21/16 0419  NA 135  --   K 3.9  --   CL 99*  --   CO2 27  --   GLUCOSE 95  --   BUN 14  --   CREATININE 0.91 0.71  CALCIUM 9.2  --    No results for input(s): LABPROT, INR in the last 72 hours.  Imaging: No results found.  Assessment/Plan: 31 yo male with right arm cellulitis and possible abscess.  --Continue IV antibiotics for now given that there is still some residual induration and erythema. --If there is no improvement by tomorrow, then would recommend CT scan of the right upper extremity to evaluate for any abscess consolidation that may be more amenable for drainage.   Howie Ill, MD [facility]

## 2016-12-22 NOTE — Progress Notes (Signed)
[facility] [place] [facility]   PATIENT NAME: Calvin Wu    MR#:  [id number]  DATE OF BIRTH:  Jan 04, 1986  SUBJECTIVE:  CHIEF COMPLAINT:   Chief Complaint  Patient presents with  . Arm Swelling   IV drug use in arm and neck, came with redness and swelling and chills, little better today. REVIEW OF SYSTEMS:  CONSTITUTIONAL: No fever, fatigue or weakness.  EYES: No blurred or double vision.  EARS, NOSE, AND THROAT: No tinnitus or ear pain.  RESPIRATORY: No cough, shortness of breath, wheezing or hemoptysis.  CARDIOVASCULAR: No chest pain, orthopnea, edema.  GASTROINTESTINAL: No nausea, vomiting, diarrhea or abdominal pain.  GENITOURINARY: No dysuria, hematuria.  ENDOCRINE: No polyuria, nocturia,  HEMATOLOGY: No anemia, easy bruising or bleeding SKIN: No rash or lesion. MUSCULOSKELETAL: No joint pain or arthritis.   NEUROLOGIC: No tingling, numbness, weakness.  PSYCHIATRY: No anxiety or depression.   ROS  DRUG ALLERGIES:  No Known Allergies  VITALS:  Blood pressure 133/69, pulse 73, temperature 98.3 F (36.8 C), temperature source Oral, resp. rate 16, height  (1.626 m), weight 86.2 kg (190 lb), SpO2 99 %.  PHYSICAL EXAMINATION:  GENERAL:  31 y.o.-year-old patient lying in the bed with no acute distress.  EYES: Pupils equal, round, reactive to light and accommodation. No scleral icterus. Extraocular muscles intact.  HEENT: Head atraumatic, normocephalic. Oropharynx and nasopharynx clear.  NECK:  Supple, no jugular venous distention. No thyroid enlargement, no tenderness. Right side lower neck- above clavicle- some redness and induration of skin with tenderness. LUNGS: Normal breath sounds bilaterally, no wheezing, rales,rhonchi or crepitation. No use of accessory muscles of respiration.  CARDIOVASCULAR: S1, S2 normal. No murmurs, rubs, or gallops.  ABDOMEN: Soft, nontender, nondistended. Bowel sounds present. No organomegaly or mass.   EXTREMITIES: No pedal edema, cyanosis, or clubbing. In right AC fossa- have swelling and tenderness, but very less redness compared to skin markings done earlier. NEUROLOGIC: Cranial nerves II through XII are intact. Muscle strength 5/5 in all extremities. Sensation intact. Gait not checked.  PSYCHIATRIC: The patient is alert and oriented x 3.  SKIN: No obvious rash, lesion, or ulcer. Multiple tattoos.  Physical Exam LABORATORY PANEL:   CBC  Recent Labs Lab 12/22/16 0510  WBC 10.8*  HGB 13.6  HCT 39.0*  PLT 288   ------------------------------------------------------------------------------------------------------------------  Chemistries   Recent Labs Lab 12/20/16 1044 12/21/16 0419  NA 135  --   K 3.9  --   CL 99*  --   CO2 27  --   GLUCOSE 95  --   BUN 14  --   CREATININE 0.91 0.71  CALCIUM 9.2  --   AST 28  --   ALT 25  --   ALKPHOS 76  --   BILITOT 0.6  --    ------------------------------------------------------------------------------------------------------------------  Cardiac Enzymes No results for input(s): TROPONINI in the last 168 hours. ------------------------------------------------------------------------------------------------------------------  RADIOLOGY:  [place] Extrem Up Right Ltd  Result Date: 12/20/2016 CLINICAL DATA:  IV drug use, antecubital abscess, redness, swelling, painful for 2 days EXAM: ULTRASOUND RIGHT UPPER EXTREMITY LIMITED TECHNIQUE: Ultrasound examination of the upper extremity soft tissues was performed in the area of clinical concern. COMPARISON:  None FINDINGS: 4 x 3.1 x 2.8 cm complex heterogeneous soft tissue abnormality with increased Doppler flow in the antecubital fossa most concerning for a phlegmon or cellulitis. Heterogeneous hypoechoic avascular area located centrally within the area of hypervascularity measuring 9 x 7 mm concerning for a small abscess.  No other fluid collection or hematoma. IMPRESSION: 1. 4 x 3.1 x 2.8  cm complex heterogeneous soft tissue abnormality with increased Doppler flow in the antecubital fossa most concerning for a phlegmon or cellulitis. Heterogeneous hypoechoic avascular area located centrally within the area of hypervascularity measuring 9 x 7 mm concerning for a small abscess. Electronically Signed   By: Elige Ko   On: 12/20/2016 10:58    ASSESSMENT AND PLAN:   Active Problems:   Cellulitis  1. Right arm and neck cellulitis without abscess at this time Continue vancomycin . Follow up on blood cultures Surgery consultation suggested conservative management. Check MRSA screen to guide oral therapy.  2. IV drug abuse: Patient was counseled to stop illicit drugs.  3. Tobacco dependence: Patient is encouraged to quit smoking. Counseling was provided for 4 minutes.  4. Leukocytosis due to problem #1 CBC in a.m.    All the records are reviewed and case discussed with Care Management/Social Workerr. Management plans discussed with the patient, family and they are in agreement.  CODE STATUS: Full.  TOTAL TIME TAKING CARE OF THIS PATIENT: 35 minutes.     POSSIBLE D/C IN 1-2 DAYS, DEPENDING ON CLINICAL CONDITION.   Altamese Dilling M.D on 12/22/2016   Between 7am to 6pm - Pager - [phone]  After 6pm go to [email or web address] - Social research officer, government  [facility]  Office  [phone]  CC: Primary care physician; No PCP Per Patient  Note: This dictation was prepared with Dragon dictation along with smaller phrase technology. Any transcriptional errors that result from this process are unintentional.

## 2016-12-22 NOTE — Progress Notes (Signed)
Patient is A&O x4. Up ad lib in room. Redness to right arm and neck has decreased. Some hardness to RUA. Oral pain meds given, with good relief. Patient slept some of the day. Girlfriend slept all day in patient's bed.

## 2016-12-22 NOTE — Progress Notes (Addendum)
Pharmacy Antibiotic Note  Calvin Wu is a 31 y.o. male admitted on 12/20/2016 with cellulitis.  Pharmacy has been consulted for vancomycin dosing. Patient currently receiving vancomycin 1250 mg IV q12h but was not administered 4/18 0600 dose for unknown reason   Plan: Given history of IVDU several days prior to admission, will target higher vancomycin trough of 15-20 mcg/mL until bacteremia is ruled out. Currently BCx NG x 2 days  4/18 1700 VT < 4; Pt was not administered previously scheduled dose, will give three more vanc doses and draw trough prior to 4th dose @ 0530 on 4/20  Height:  (162.6 cm) Weight: 190 lb (86.2 kg) IBW/kg (Calculated) : 59.2  Temp (24hrs), Avg:97.9 F (36.6 C), Min:97.2 F (36.2 C), Max:98.5 F (36.9 C)   Recent Labs Lab 12/20/16 1044 12/21/16 0419 12/22/16 0510 12/22/16 1719  WBC 14.8* 12.5* 10.8*  --   CREATININE 0.91 0.71  --   --   LATICACIDVEN 0.8  --   --   --   VANCOTROUGH  --   --   --  <4*    Estimated Creatinine Clearance: 132.5 mL/min (by C-G formula based on SCr of 0.71 mg/dL).    No Known Allergies  Antimicrobials this admission: vancomycin 4/16 >>   Dose adjustments this admission:  Microbiology results: 4/16 BCx: NGx2 days 4/17 MRSA PCR negative  Thank you for allowing pharmacy to be a part of this patient's care.  Horris Latino, PharmD Pharmacy Resident 12/22/2016 6:30 PM

## 2016-12-22 NOTE — Progress Notes (Signed)
[facility] - [place] at [facility]   PATIENT NAME: Calvin Wu    MR#:  [id number]  DATE OF BIRTH:  June 13, 1986  SUBJECTIVE:  CHIEF COMPLAINT:   Chief Complaint  Patient presents with  . Arm Swelling   IV drug use in arm and neck, came with redness and swelling and chills, little better today. Still have pain and can not move his forearm completely. REVIEW OF SYSTEMS:  CONSTITUTIONAL: No fever, fatigue or weakness.  EYES: No blurred or double vision.  EARS, NOSE, AND THROAT: No tinnitus or ear pain.  RESPIRATORY: No cough, shortness of breath, wheezing or hemoptysis.  CARDIOVASCULAR: No chest pain, orthopnea, edema.  GASTROINTESTINAL: No nausea, vomiting, diarrhea or abdominal pain.  GENITOURINARY: No dysuria, hematuria.  ENDOCRINE: No polyuria, nocturia,  HEMATOLOGY: No anemia, easy bruising or bleeding SKIN: No rash or lesion. MUSCULOSKELETAL: No joint pain or arthritis.   NEUROLOGIC: No tingling, numbness, weakness.  PSYCHIATRY: No anxiety or depression.   ROS  DRUG ALLERGIES:  No Known Allergies  VITALS:  Blood pressure 137/88, pulse 70, temperature 97.2 F (36.2 C), temperature source Oral, resp. rate 18, height  (1.626 m), weight 86.2 kg (190 lb), SpO2 100 %.  PHYSICAL EXAMINATION:  GENERAL:  31 y.o.-year-old patient lying in the bed with no acute distress.  EYES: Pupils equal, round, reactive to light and accommodation. No scleral icterus. Extraocular muscles intact.  HEENT: Head atraumatic, normocephalic. Oropharynx and nasopharynx clear.  NECK:  Supple, no jugular venous distention. No thyroid enlargement, no tenderness. Right side lower neck- above clavicle- some redness and induration of skin with tenderness. LUNGS: Normal breath sounds bilaterally, no wheezing, rales,rhonchi or crepitation. No use of accessory muscles of respiration.  CARDIOVASCULAR: S1, S2 normal. No murmurs, rubs, or gallops.  ABDOMEN: Soft, nontender, nondistended.  Bowel sounds present. No organomegaly or mass.  EXTREMITIES: No pedal edema, cyanosis, or clubbing. In right AC fossa- have swelling and tenderness, but very less redness compared to skin markings done earlier. NEUROLOGIC: Cranial nerves II through XII are intact. Muscle strength 5/5 in all extremities. Sensation intact. Gait not checked.  PSYCHIATRIC: The patient is alert and oriented x 3.  SKIN: No obvious rash, lesion, or ulcer. Multiple tattoos.  Physical Exam LABORATORY PANEL:   CBC  Recent Labs Lab 12/22/16 0510  WBC 10.8*  HGB 13.6  HCT 39.0*  PLT 288   ------------------------------------------------------------------------------------------------------------------  Chemistries   Recent Labs Lab 12/20/16 1044 12/21/16 0419  NA 135  --   K 3.9  --   CL 99*  --   CO2 27  --   GLUCOSE 95  --   BUN 14  --   CREATININE 0.91 0.71  CALCIUM 9.2  --   AST 28  --   ALT 25  --   ALKPHOS 76  --   BILITOT 0.6  --    ------------------------------------------------------------------------------------------------------------------  Cardiac Enzymes No results for input(s): TROPONINI in the last 168 hours. ------------------------------------------------------------------------------------------------------------------  RADIOLOGY:  No results found.  ASSESSMENT AND PLAN:   Active Problems:   Cellulitis  1. Right arm and neck cellulitis without abscess at this time Continue vancomycin . Follow up on blood cultures- negative so far. Surgery consultation suggested conservative management. Checked MRSA screen to guide oral therapy- negative. As per surgery- cont IV today, if still have pain tomorrow- may do CT to check for drainable abscess.  2. IV drug abuse: Patient was counseled to stop illicit drugs.  3. Tobacco dependence: Patient is  encouraged to quit smoking. Counseling was provided for 4 minutes.  4. Leukocytosis due to problem #1 CBC in a.m.   All  the records are reviewed and case discussed with Care Management/Social Workerr. Management plans discussed with the patient, family and they are in agreement.  CODE STATUS: Full.  TOTAL TIME TAKING CARE OF THIS PATIENT: 35 minutes.    POSSIBLE D/C IN 1-2 DAYS, DEPENDING ON CLINICAL CONDITION.   Altamese Dilling M.D on 12/22/2016   Between 7am to 6pm - Pager - [phone]  After 6pm go to [email or web address] - Social research officer, government  [facility]  Office  [phone]  CC: Primary care physician; No PCP Per Patient  Note: This dictation was prepared with Dragon dictation along with smaller phrase technology. Any transcriptional errors that result from this process are unintentional.

## 2016-12-23 LAB — CBC
HCT: 39.3 % — ABNORMAL LOW (ref 40.0–52.0)
Hemoglobin: 13.4 g/dL (ref 13.0–18.0)
MCH: 29.6 pg (ref 26.0–34.0)
MCHC: 34.1 g/dL (ref 32.0–36.0)
MCV: 86.8 fL (ref 80.0–100.0)
PLATELETS: 278 10*3/uL (ref 150–440)
RBC: 4.53 MIL/uL (ref 4.40–5.90)
RDW: 12.5 % (ref 11.5–14.5)
WBC: 8.4 10*3/uL (ref 3.8–10.6)

## 2016-12-23 MED ORDER — SULFAMETHOXAZOLE-TRIMETHOPRIM 800-160 MG PO TABS: 1.0000 | ORAL_TABLET | Freq: Two times a day (BID) | ORAL | 0 refills | Status: DC

## 2016-12-23 NOTE — Progress Notes (Signed)
12/23/2016  Subjective: No acute events. Patient reports that he is having some pain still in his right upper extremity.  However, his WBC has normalized.  Vital signs: Temp:  [97.2 F (36.2 C)-98.5 F (36.9 C)] 97.2 F (36.2 C) (04/18 1612) Pulse Rate:  [70-72] 70 (04/18 1612) Resp:  [16-18] 18 (04/18 1612) BP: (127-137)/(68-88) 137/88 (04/18 1612) SpO2:  [98 %-100 %] 100 % (04/18 1612)   Intake/Output: 04/18 0701 - 04/19 0700 In: 360 [P.O.:360] Out: -  Last BM Date: 12/18/16  Physical Exam: Constitutional: No acute distress Extremity:  Right AC fossa with no further erythema.  There area is very soft, with only much small area that's still somewhat firm.  However, his exam has improved considerably since admission.  Labs:   Recent Labs  12/22/16 0510 12/23/16 0445  WBC 10.8* 8.4  HGB 13.6 13.4  HCT 39.0* 39.3*  PLT 288 278    Recent Labs  12/20/16 1044 12/21/16 0419  NA 135  --   K 3.9  --   CL 99*  --   CO2 27  --   GLUCOSE 95  --   BUN 14  --   CREATININE 0.91 0.71  CALCIUM 9.2  --    No results for input(s): LABPROT, INR in the last 72 hours.  Imaging: No results found.  Assessment/Plan: 31 yo male with right arm cellulitis  --Currently his WBC is normal and his erythema has resolved.  Unclear how much of this is his own pain / drug abuse rather than true infection.  There is no pressing need for further imaging at this point. --From surgical standpoint, may discharge home.  Recommended to the patient that he continue his antibiotic as prescribed and apply warm compresses to the area as well.  Discussed with Dr. Cherlynn Kaiser.   Calvin Ill, MD [facility]

## 2016-12-23 NOTE — Discharge Summary (Signed)
[facility] - [place] at [facility]   PATIENT NAME: Calvin Wu    MR#:  [id number]  DATE OF BIRTH:  May 06, 1986  DATE OF ADMISSION:  12/20/2016 ADMITTING PHYSICIAN: Adrian Saran, MD  DATE OF DISCHARGE: 12/23/2016 11:18 AM  PRIMARY CARE PHYSICIAN: No PCP Per Patient    ADMISSION DIAGNOSIS:  Abscess of arm, right [L02.413] Cellulitis of arm, right [L03.113]  DISCHARGE DIAGNOSIS:  Active Problems:   Cellulitis   SECONDARY DIAGNOSIS:  History reviewed. No pertinent past medical history.  HOSPITAL COURSE:   31 year old male with past medical history of IV drug abuse who presented to the hospital with a right upper extremity redness swelling and induration.  1. Acute right upper extremity cellulitis-patient was treated with IV vancomycin, and ultrasound was obtained which showed no evidence of acute abscess. Surgical consultation was obtained who followed the patient and did not recommend any acute aggressive incision and drainage or intervention. -After getting a few days of IV antibiotics, patient's redness has resolved he does have some small induration but his white cell count is normal and he is afebrile and therefore not being discharged on oral Bactrim for additional 10 days.  2. History of IV drug abuse-patient will resume his Suboxone program.  DISCHARGE CONDITIONS:   Stable  CONSULTS OBTAINED:  Treatment Team:  Henrene Dodge, MD  DRUG ALLERGIES:  No Known Allergies  DISCHARGE MEDICATIONS:   Allergies as of 12/23/2016   No Known Allergies     Medication List    TAKE these medications   SUBOXONE 8-2 MG Film Generic drug:  Buprenorphine HCl-Naloxone HCl Place 1 Film under the tongue 2 (two) times daily.   sulfamethoxazole-trimethoprim 800-160 MG tablet Commonly known as:  BACTRIM DS Take 1 tablet by mouth 2 (two) times daily.         DISCHARGE INSTRUCTIONS:   DIET:  Regular diet  DISCHARGE CONDITION:  Stable  ACTIVITY:   Activity as tolerated  OXYGEN:  Home Oxygen: No.   Oxygen Delivery: room air  DISCHARGE LOCATION:  home   If you experience worsening of your admission symptoms, develop shortness of breath, life threatening emergency, suicidal or homicidal thoughts you must seek medical attention immediately by calling 911 or calling your MD immediately  if symptoms less severe.  You Must read complete instructions/literature along with all the possible adverse reactions/side effects for all the Medicines you take and that have been prescribed to you. Take any new Medicines after you have completely understood and accpet all the possible adverse reactions/side effects.   Please note  You were cared for by a hospitalist during your hospital stay. If you have any questions about your discharge medications or the care you received while you were in the hospital after you are discharged, you can call the unit and asked to speak with the hospitalist on call if the hospitalist that took care of you is not available. Once you are discharged, your primary care physician will handle any further medical issues. Please note that NO REFILLS for any discharge medications will be authorized once you are discharged, as it is imperative that you return to your primary care physician (or establish a relationship with a primary care physician if you do not have one) for your aftercare needs so that they can reassess your need for medications and monitor your lab values.     Today   No fever, Pain in the right forearm has improved. No redness and induration improved.    VITAL  SIGNS:  Blood pressure 137/83, pulse 77, temperature 97.9 F (36.6 C), temperature source Oral, resp. rate 16, height  (1.626 m), weight 86.2 kg (190 lb), SpO2 98 %.  I/O:   Intake/Output Summary (Last 24 hours) at 12/23/16 1730 Last data filed at 12/23/16 1025  Gross per 24 hour  Intake              480 ml  Output                0 ml   Net              480 ml    PHYSICAL EXAMINATION:  GENERAL:  31 y.o.-year-old patient lying in the bed with no acute distress.  EYES: Pupils equal, round, reactive to light and accommodation. No scleral icterus. Extraocular muscles intact.  HEENT: Head atraumatic, normocephalic. Oropharynx and nasopharynx clear.  NECK:  Supple, no jugular venous distention. No thyroid enlargement, no tenderness.  LUNGS: Normal breath sounds bilaterally, no wheezing, rales,rhonchi. No use of accessory muscles of respiration.  CARDIOVASCULAR: S1, S2 normal. No murmurs, rubs, or gallops.  ABDOMEN: Soft, non-tender, non-distended. Bowel sounds present. No organomegaly or mass.  EXTREMITIES: No pedal edema, cyanosis, or clubbing. Right upper Ext. Antecubital area induration but no redness or drainage.  NEUROLOGIC: Cranial nerves II through XII are intact. No focal motor or sensory defecits b/l.  PSYCHIATRIC: The patient is alert and oriented x 3. Good affect.  SKIN: No obvious rash, lesion, or ulcer. Multiple tattoos over body.   DATA REVIEW:   CBC  Recent Labs Lab 12/23/16 0445  WBC 8.4  HGB 13.4  HCT 39.3*  PLT 278    Chemistries   Recent Labs Lab 12/20/16 1044 12/21/16 0419  NA 135  --   K 3.9  --   CL 99*  --   CO2 27  --   GLUCOSE 95  --   BUN 14  --   CREATININE 0.91 0.71  CALCIUM 9.2  --   AST 28  --   ALT 25  --   ALKPHOS 76  --   BILITOT 0.6  --     Cardiac Enzymes No results for input(s): TROPONINI in the last 168 hours.  Microbiology Results  Results for orders placed or performed during the hospital encounter of 12/20/16  Blood culture (routine x 2)     Status: None (Preliminary result)   Collection Time: 12/20/16 12:08 PM  Result Value Ref Range Status   Specimen Description BLOOD LEFT HAND  Final   Special Requests   Final    BOTTLES DRAWN AEROBIC AND ANAEROBIC Blood Culture adequate volume   Culture NO GROWTH 3 DAYS  Final   Report Status PENDING  Incomplete   Blood culture (routine x 2)     Status: None (Preliminary result)   Collection Time: 12/20/16 12:08 PM  Result Value Ref Range Status   Specimen Description BLOOD LEFT ARM  Final   Special Requests   Final    BOTTLES DRAWN AEROBIC AND ANAEROBIC Blood Culture adequate volume   Culture NO GROWTH 3 DAYS  Final   Report Status PENDING  Incomplete  MRSA PCR Screening     Status: None   Collection Time: 12/21/16 10:53 AM  Result Value Ref Range Status   MRSA by PCR NEGATIVE NEGATIVE Final    Comment:        The GeneXpert MRSA Assay (FDA approved for NASAL specimens only), is one component of  a comprehensive MRSA colonization surveillance program. It is not intended to diagnose MRSA infection nor to guide or monitor treatment for MRSA infections.     RADIOLOGY:  No results found.    Management plans discussed with the patient, family and they are in agreement.  CODE STATUS:  Code Status History    Date Active Date Inactive Code Status Order ID Comments User Context   12/20/2016  3:50 PM 12/23/2016  2:23 PM Full Code [id number]  Adrian Saran, MD Inpatient      TOTAL TIME TAKING CARE OF THIS PATIENT: 40 minutes.    Houston Siren M.D on 12/23/2016 at 5:30 PM  Between 7am to 6pm - Pager - [phone]  After 6pm go to [email or web address] - Scientist, research (life sciences) [facility]  Office  [phone]  CC: Primary care physician; No PCP Per Patient

## 2016-12-23 NOTE — Progress Notes (Signed)
Patient was discharged home with girlfriend. IV removed with cath intact. Script given to patient. Reviewed ABX ans S&S along with finished whole script. Allowed time for questions.

## 2016-12-25 LAB — CULTURE, BLOOD (ROUTINE X 2)
Culture: NO GROWTH
Culture: NO GROWTH
SPECIAL REQUESTS: ADEQUATE
Special Requests: ADEQUATE

## 2017-09-12 ENCOUNTER — Other Ambulatory Visit: Payer: Self-pay

## 2017-09-12 ENCOUNTER — Emergency Department
Admission: EM | Admit: 2017-09-12 | Discharge: 2017-09-12 | Disposition: A | Discharge status: Home / Self Care | Payer: Self-pay | Attending: Emergency Medicine | Admitting: Emergency Medicine

## 2017-09-12 DIAGNOSIS — K047 Periapical abscess without sinus: Secondary | ICD-10-CM | POA: Insufficient documentation

## 2017-09-12 DIAGNOSIS — Z79899 Other long term (current) drug therapy: Secondary | ICD-10-CM | POA: Insufficient documentation

## 2017-09-12 DIAGNOSIS — F17200 Nicotine dependence, unspecified, uncomplicated: Secondary | ICD-10-CM | POA: Insufficient documentation

## 2017-09-12 MED ORDER — CLINDAMYCIN PHOSPHATE 300 MG/2ML IJ SOLN
INTRAMUSCULAR | Status: AC
  Administered 2017-09-12: 600 mg via INTRAMUSCULAR
  Filled 2017-09-12: qty 4

## 2017-09-12 MED ORDER — CLINDAMYCIN PHOSPHATE 600 MG/4ML IJ SOLN: 600.0000 mg | Freq: Once | INTRAMUSCULAR | Status: DC

## 2017-09-12 MED ORDER — OXYCODONE-ACETAMINOPHEN 5-325 MG PO TABS
1.0000 | ORAL_TABLET | Freq: Once | ORAL | Status: AC
  Administered 2017-09-12: 1 via ORAL
  Filled 2017-09-12: qty 1

## 2017-09-12 MED ORDER — CLINDAMYCIN PHOSPHATE 900 MG/6ML IJ SOLN
600.0000 mg | Freq: Once | INTRAMUSCULAR | Status: AC
  Administered 2017-09-12: 600 mg via INTRAMUSCULAR
  Filled 2017-09-12: qty 4

## 2017-09-12 MED ORDER — CLINDAMYCIN HCL 300 MG PO CAPS: 300.0000 mg | ORAL_CAPSULE | Freq: Three times a day (TID) | ORAL | 0 refills | Status: AC

## 2017-09-12 NOTE — ED Provider Notes (Signed)
[facility] Emergency Department Provider Note  ____________________________________________  Time seen: Approximately 5:17 PM  I have reviewed the triage vital signs and the nursing notes.   HISTORY  Chief Complaint Dental Pain    HPI Calvin Wu is a 32 y.o. male that presents to the emergency department for evaluation of left lower tooth pain for months that worsened 2 days ago. He had chills last night.  He has not seen a dentist in many years.  No alleviating measures have been attempted.  History reviewed. No pertinent past medical history.  Patient Active Problem List   Diagnosis Date Noted  . Cellulitis 12/20/2016  . Cellulitis of arm, right     History reviewed. No pertinent surgical history.  Prior to Admission medications   Medication Sig Start Date End Date Taking? Authorizing Provider  clindamycin (CLEOCIN) 300 MG capsule Take 1 capsule (300 mg total) by mouth 3 (three) times daily for 10 days. 09/12/17 09/22/17  Enid Derry, PA-C  SUBOXONE 8-2 MG FILM Place 1 Film under the tongue 2 (two) times daily. 12/19/16   [provider]  sulfamethoxazole-trimethoprim (BACTRIM DS) 800-160 MG tablet Take 1 tablet by mouth 2 (two) times daily. 12/23/16   Houston Siren, MD    Allergies Patient has no known allergies.  No family history on file.  Social History Social History   Tobacco Use  . Smoking status: Current Every Day Smoker  . Smokeless tobacco: Never Used  Substance Use Topics  . Alcohol use: Yes  . Drug use: Yes    Types: Cocaine, IV, Methamphetamines     Review of Systems  Cardiovascular: No chest pain. Respiratory: No SOB. Gastrointestinal: No abdominal pain.  No nausea, no vomiting.  Musculoskeletal: Negative for musculoskeletal pain. Skin: Negative for rash, abrasions, lacerations, ecchymosis.   ____________________________________________   PHYSICAL EXAM:  VITAL SIGNS: ED Triage Vitals  Enc Vitals  Group     BP 09/12/17 1329 (!) 149/93     Pulse Rate 09/12/17 1329 (!) 52     Resp 09/12/17 1329 16     Temp 09/12/17 1329 98.4 F (36.9 C)     Temp Source 09/12/17 1329 Oral     SpO2 09/12/17 1329 96 %     Weight 09/12/17 1323 185 lb (83.9 kg)     Height 09/12/17 1323 5\' 9"  (1.753 m)     Head Circumference --      Peak Flow --      Pain Score 09/12/17 1323 10     Pain Loc --      Pain Edu? --      Excl. in GC? --      Constitutional: Alert and oriented. Well appearing and in no acute distress. Eyes: Conjunctivae are normal. PERRL. EOMI. Head: Atraumatic. ENT:      Ears:      Nose: No congestion/rhinnorhea.      Mouth/Throat: Mucous membranes are moist. Tenderness to palpation over left bottom lateral incisor. No drainage. Mild swelling to left check and jaw.  Neck: No stridor.  Cardiovascular: Normal rate, regular rhythm.  Good peripheral circulation. Respiratory: Normal respiratory effort without tachypnea or retractions. Lungs CTAB. Good air entry to the bases with no decreased or absent breath sounds. Musculoskeletal: Full range of motion to all extremities. No gross deformities appreciated. Neurologic:  Normal speech and language. No gross focal neurologic deficits are appreciated.  Skin:  Skin is warm, dry and intact. No rash noted.   ____________________________________________  LABS (all labs ordered are listed, but only abnormal results are displayed)  Labs Reviewed - No data to display ____________________________________________  EKG   ____________________________________________  RADIOLOGY   No results found.  ____________________________________________    PROCEDURES  Procedure(s) performed:    Procedures    Medications  oxyCODONE-acetaminophen (PERCOCET/ROXICET) 5-325 MG per tablet 1 tablet (1 tablet Oral Given 09/12/17 1449)  clindamycin (CLEOCIN) injection 600 mg (600 mg Intramuscular Given 09/12/17 1450)      ____________________________________________   INITIAL IMPRESSION / ASSESSMENT AND PLAN / ED COURSE  Pertinent labs & imaging results that were available during my care of the patient were reviewed by me and considered in my medical decision making (see chart for details).  Review of the [place] CSRS was performed in accordance of the [facility] prior to dispensing any controlled drugs.   Patient's diagnosis is consistent with dental abscess.  Vital signs and exam are reassuring.  IM clindamycin was given.  Dental resources was provided.  Patient will be discharged home with prescriptions for clindamycin. Patient is to follow up with dentist as directed. Patient is given ED precautions to return to the ED for any worsening or new symptoms.     ____________________________________________  FINAL CLINICAL IMPRESSION(S) / ED DIAGNOSES  Final diagnoses:  Dental abscess      NEW MEDICATIONS STARTED DURING THIS VISIT:  ED Discharge Orders        Ordered    clindamycin (CLEOCIN) 300 MG capsule  3 times daily     09/12/17 1521          This chart was dictated using voice recognition software/Dragon. Despite best efforts to proofread, errors can occur which can change the meaning. Any change was purely unintentional.    Enid DerryWagner, Zion Lint, PA-C 09/12/17 1721    Emily FilbertWilliams, Jonathan E, MD 09/13/17 [phone]

## 2017-09-12 NOTE — ED Triage Notes (Signed)
Pt c/o left lower tooth pain for the past couple of days, states the tooth has broke off..Marland Kitchen

## 2017-09-12 NOTE — Discharge Instructions (Signed)
OPTIONS FOR DENTAL FOLLOW UP CARE ° °[place] Department of Health and Human Services - Local Safety Net Dental Clinics °http://www.ncdhhs.gov/dph/oralhealth/services/safetynetclinics.htm °  °[facility] ([phone]) ° °[facility] ([phone]) ° °[facility] ([phone] ext [phone]) ° °[facility] ([phone]) ° °[facility] ([phone]) °This clinic caters to the indigent population and is on a lottery system. °Location: °[facility], [address], [place] °Clinic Hours: °Wednesdays from 6pm - 9pm, patients seen by a lottery system. °For dates, call or go to [email or web address] °Services: °Cleanings, fillings and simple extractions. °Payment Options: °DENTAL WORK IS FREE OF CHARGE. Bring proof of income or support. °Best way to get seen: °Arrive at 5:15 pm - this is a lottery, NOT first come/first serve, so arriving earlier will not increase your chances of being seen. °  °  °[facility] °[phone] °Select option 1 for emergencies °  °Location: °[facility], [address], [place] °Clinic Hours: °No walk-ins accepted - call the day before to schedule an appointment. °Check in times are 9:30 am and 1:30 pm. °Services: °Simple extractions, temporary fillings, pulpectomy/pulp debridement, uncomplicated abscess drainage. °Payment Options: °PAYMENT IS DUE AT THE TIME OF SERVICE.  Fee is usually $100-200, additional surgical procedures (e.g. abscess drainage) may be extra. °Cash, checks, Visa/MasterCard accepted.  Can file Medicaid if patient is covered for dental - patient should call case worker to check. °No discount for [facility] patients. °Best way to get seen: °MUST call the day before and get onto the schedule. Can usually be seen the next 1-2 days. No walk-ins accepted. °  °  °[facility] °[phone] °   °Location: °[facility], [address], [place] °Clinic Hours: °M, W, Th, F 8am or 1:30pm, Tues 9a or 1:30 - first come/first served. °Services: °Simple extractions, temporary fillings, uncomplicated abscess drainage.  You do not need to be an [place] resident. °Payment Options: °PAYMENT IS DUE AT THE TIME OF SERVICE. °Dental insurance, otherwise sliding scale - bring proof of income or support. °Depending on income and treatment needed, cost is usually $50-200. °Best way to get seen: °Arrive early as it is first come/first served. °  °  °[facility] °[phone] °  °Location: °[address] °Clinic Hours: °Mon-Thu 8a-5p °Services: °Most basic dental services including extractions and fillings. °Payment Options: °PAYMENT IS DUE AT THE TIME OF SERVICE. °Sliding scale, up to 50% off - bring proof if income or support. °Medicaid with dental option accepted. °Best way to get seen: °Call to schedule an appointment, can usually be seen within 2 weeks OR they will try to see walk-ins - show up at 8a or 2p (you may have to wait). °  °  °[facility] °[phone] °[place] RESIDENTS ONLY °  °Location: °[facility], [address], [place], [place] [address] °Clinic Hours: By appointment only. °Monday - Thursday 8am-5pm, Friday 8am-12pm °Services: Cleanings, fillings, extractions. °Payment Options: °PAYMENT IS DUE AT THE TIME OF SERVICE. °Cash, Visa or MasterCard. Sliding scale - $30 minimum per service. °Best way to get seen: °Come in to office, complete packet and make an appointment - need proof of income °or support monies for each household member and proof of [place] residence. °Usually takes about a month to get in. °  °  °[facility] °[phone] °  °Location: °[address]   [place] °Clinic Hours: Walk-in Urgent Care Dental Services are offered Monday-Friday  mornings only. °The numbers of emergencies accepted daily is limited to the number of °providers available. °Maximum 15 - Mondays, Wednesdays & Thursdays °Maximum 10 - Tuesdays & Fridays °Services: °You do not need to be a [place] resident to be seen for a dental emergency. °Emergencies are defined as pain, swelling, abnormal bleeding, or dental trauma. Walkins will receive x-rays if needed. °NOTE: Dental cleaning is not an emergency. °Payment Options: °PAYMENT IS DUE AT THE TIME OF SERVICE. °Minimum co-pay is $40.00 for uninsured patients. °Minimum co-pay is $3.00 for Medicaid with dental coverage. °Dental Insurance is accepted and must be presented at time of visit. °Medicare does not cover dental. °Forms of payment: Cash, credit card, checks. °Best way to get seen: °If not previously registered with the clinic, walk-in dental registration begins at 7:15 am and is on a first come/first serve basis. °If previously registered with the clinic, call to make an appointment. °  °  °[facility] °[phone] °[place] RESIDENTS ONLY °  °Location: °[address], [place], [place] °Clinic Hours: °Mon-Thu 10a-2p °Services: Extractions only! °Payment Options: °FREE (donations accepted) - bring proof of income or support °Best way to get seen: °Call and schedule an appointment OR come at 8am on the 1st Monday of every month (except for holidays) when it is first come/first served. °  °  °[facility] °[phone] °  °Location: °[address], [place] °Clinic Hours: °Friday mornings °Services, Payment Options, Best way to get seen: °Call for info °

## 2017-09-12 NOTE — ED Notes (Signed)
See triage note  Presents with a 2-3 days hx of dental pain  States he has a broken tooth to left lower

## 2017-09-16 ENCOUNTER — Encounter: Payer: Self-pay | Admitting: *Deleted

## 2017-09-16 ENCOUNTER — Other Ambulatory Visit: Payer: Self-pay

## 2017-09-16 ENCOUNTER — Emergency Department
Admission: EM | Admit: 2017-09-16 | Discharge: 2017-09-16 | Disposition: A | Discharge status: Home / Self Care | Payer: Self-pay | Attending: Student in an Organized Health Care Education/Training Program | Admitting: Student in an Organized Health Care Education/Training Program

## 2017-09-16 DIAGNOSIS — Z79899 Other long term (current) drug therapy: Secondary | ICD-10-CM | POA: Insufficient documentation

## 2017-09-16 DIAGNOSIS — F17210 Nicotine dependence, cigarettes, uncomplicated: Secondary | ICD-10-CM | POA: Insufficient documentation

## 2017-09-16 DIAGNOSIS — K047 Periapical abscess without sinus: Secondary | ICD-10-CM | POA: Insufficient documentation

## 2017-09-16 MED ORDER — CEFTRIAXONE SODIUM 1 G IJ SOLR
1.0000 g | Freq: Once | INTRAMUSCULAR | Status: AC
  Administered 2017-09-16: 1 g via INTRAMUSCULAR
  Filled 2017-09-16: qty 10

## 2017-09-16 MED ORDER — LIDOCAINE VISCOUS 2 % MT SOLN
15.0000 mL | OROMUCOSAL | Status: DC | PRN
Start: 2017-09-16 — End: 2017-09-17
  Administered 2017-09-16: 15 mL via OROMUCOSAL
  Filled 2017-09-16: qty 15

## 2017-09-16 MED ORDER — KETOROLAC TROMETHAMINE 10 MG PO TABS: 10.0000 mg | ORAL_TABLET | Freq: Four times a day (QID) | ORAL | 0 refills | Status: DC | PRN

## 2017-09-16 MED ORDER — KETOROLAC TROMETHAMINE 30 MG/ML IJ SOLN
30.0000 mg | Freq: Once | INTRAMUSCULAR | Status: AC
Start: 2017-09-16 — End: 2017-09-16
  Administered 2017-09-16: 30 mg via INTRAMUSCULAR
  Filled 2017-09-16: qty 1

## 2017-09-16 NOTE — ED Notes (Signed)
Reviewed discharge instructions, follow-up care, and prescriptions with patient. Patient verbalized understanding of all information reviewed. Patient stable, with no distress noted at this time.    

## 2017-09-16 NOTE — Discharge Instructions (Signed)
Please call and schedule a dental appointment as soon as possible. You will need to be seen within the next 14 days. Return to the emergency department for symptoms that change or worsen if you're unable to schedule an appointment.  OPTIONS FOR DENTAL FOLLOW UP CARE  [place] Department of Health and Human Services - Local Safety Net Dental Clinics http://www.ncdhhs.gov/dph/oralhealth/services/safetynetclinics.htm   [facility] ([phone])  [facility] ([phone])  [facility] ([phone] ext [phone])  [facility] ([phone])  [facility] ([phone]) This clinic caters to the indigent population and is on a lottery system. Location: [facility], [address], [place] Clinic Hours: Wednesdays from 6pm - 9pm, patients seen by a lottery system. For dates, call or go to [email or web address] Services: Cleanings, fillings and simple extractions. Payment Options: DENTAL WORK IS FREE OF CHARGE. Bring proof of income or support. Best way to get seen: Arrive at 5:15 pm - this is a lottery, NOT first come/first serve, so arriving earlier will not increase your chances of being seen.     [facility] [phone] Select option 1 for emergencies   Location: [facility], [address], [place] Clinic Hours: No walk-ins accepted - call the day before to schedule an appointment. Check in times are 9:30 am and 1:30 pm. Services: Simple extractions, temporary fillings, pulpectomy/pulp debridement, uncomplicated abscess drainage. Payment Options: PAYMENT IS DUE AT THE TIME OF SERVICE.  Fee is usually $100-200, additional surgical procedures (e.g. abscess drainage) may be extra. Cash, checks, Visa/MasterCard accepted.  Can file Medicaid if patient is covered for dental - patient should call case worker to check. No  discount for [facility] patients. Best way to get seen: MUST call the day before and get onto the schedule. Can usually be seen the next 1-2 days. No walk-ins accepted.     [facility] [phone]   Location: [facility], [address], [place] Clinic Hours: M, W, Th, F 8am or 1:30pm, Tues 9a or 1:30 - first come/first served. Services: Simple extractions, temporary fillings, uncomplicated abscess drainage.  You do not need to be an [place] resident. Payment Options: PAYMENT IS DUE AT THE TIME OF SERVICE. Dental insurance, otherwise sliding scale - bring proof of income or support. Depending on income and treatment needed, cost is usually $50-200. Best way to get seen: Arrive early as it is first come/first served.     [facility] [phone]   Location: [address] Clinic Hours: Mon-Thu 8a-5p Services: Most basic dental services including extractions and fillings. Payment Options: PAYMENT IS DUE AT THE TIME OF SERVICE. Sliding scale, up to 50% off - bring proof if income or support. Medicaid with dental option accepted. Best way to get seen: Call to schedule an appointment, can usually be seen within 2 weeks OR they will try to see walk-ins - show up at 8a or 2p (you may have to wait).     [facility] [phone] [place] ONLY   Location: [facility], [address], [place],  [address] Clinic Hours: By appointment only. Monday - Thursday 8am-5pm, Friday 8am-12pm Services: Cleanings, fillings, extractions. Payment Options: PAYMENT IS DUE AT THE TIME OF SERVICE. Cash, Visa or MasterCard. Sliding scale - $30 minimum per service. Best way to get seen: Come in to office, complete packet and make an appointment -   need proof of income or support monies for each household member and proof of [place]  residence. Usually takes about a month to get in.     [facility] [phone]   Location: [address] [place] Clinic Hours: Walk-in Urgent Care Dental Services are offered Monday-Friday mornings only. The numbers of emergencies accepted daily is limited to the number of providers available. Maximum 15 - Mondays, Wednesdays & Thursdays Maximum 10 - Tuesdays & Fridays Services: You do not need to be a [place] resident to be seen for a dental emergency. Emergencies are defined as pain, swelling, abnormal bleeding, or dental trauma. Walkins will receive x-rays if needed. NOTE: Dental cleaning is not an emergency. Payment Options: PAYMENT IS DUE AT THE TIME OF SERVICE. Minimum co-pay is $40.00 for uninsured patients. Minimum co-pay is $3.00 for Medicaid with dental coverage. Dental Insurance is accepted and must be presented at time of visit. Medicare does not cover dental. Forms of payment: Cash, credit card, checks. Best way to get seen: If not previously registered with the clinic, walk-in dental registration begins at 7:15 am and is on a first come/first serve basis. If previously registered with the clinic, call to make an appointment.     [facility] [phone] [place] RESIDENTS ONLY   Location: [address], [place], [place] Clinic Hours: Mon-Thu 10a-2p Services: Extractions only! Payment Options: FREE (donations accepted) - bring proof of income or support Best way to get seen: Call and schedule an appointment OR come at 8am on the 1st Monday of every month (except for holidays) when it is first come/first served.     [facility] [phone]   Location: [address], [place] Clinic Hours: Friday mornings Services, Payment Options, Best way to get seen: Call for info  

## 2017-09-16 NOTE — ED Triage Notes (Addendum)
Pt to ED reporting increased pain in left lower molar. PT reports he was seen in ED 4 days ago and given antibiotics. PT reports the swelling has not gone down enough and he stated he has not followed up with a dentist. RN able to see tooth in lower left jaw that is black in color. No increased swelling noted. No airway swelling noted. Pt is stumbling back to ED treatment room and reports the pain is making it hard to walk. Pt unsure if he has had fevers at home and reports he did not check.

## 2017-09-16 NOTE — ED Provider Notes (Signed)
[facility] Emergency Department Provider Note ____________________________________________  Time seen: Approximately 11:28 PM  I have reviewed the triage vital signs and the nursing notes.   HISTORY  Chief Complaint Dental Pain   HPI Calvin R [place] is a 32 y.o. male who presents to the emergency department for a second evaluation of dental pain. He does not feel that he has improved with the clindamycin. He is taking tylenol and ibuprofen without relief. He states that he has not been able to sleep due to the pain.  History reviewed. No pertinent past medical history.  Patient Active Problem List   Diagnosis Date Noted  . Cellulitis 12/20/2016  . Cellulitis of arm, right     History reviewed. No pertinent surgical history.  Prior to Admission medications   Medication Sig Start Date End Date Taking? Authorizing Provider  clindamycin (CLEOCIN) 300 MG capsule Take 1 capsule (300 mg total) by mouth 3 (three) times daily for 10 days. 09/12/17 09/22/17  Enid DerryWagner, Ashley, PA-C  ketorolac (TORADOL) 10 MG tablet Take 1 tablet (10 mg total) by mouth every 6 (six) hours as needed. 09/16/17   Qiana Landgrebe B, FNP  SUBOXONE 8-2 MG FILM Place 1 Film under the tongue 2 (two) times daily. 12/19/16   [provider]  sulfamethoxazole-trimethoprim (BACTRIM DS) 800-160 MG tablet Take 1 tablet by mouth 2 (two) times daily. 12/23/16   Houston SirenSainani, Vivek J, MD    Allergies Patient has no known allergies.  History reviewed. No pertinent family history.  Social History Social History   Tobacco Use  . Smoking status: Current Every Day Smoker  . Smokeless tobacco: Never Used  Substance Use Topics  . Alcohol use: Yes  . 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     Review of Systems Constitutional: Negative for fever. Positive for chills ENT: Positive for dental pain. Musculoskeletal:  Negative for joint pain  Skin: Positive for lower jaw swelling. ____________________________________________   PHYSICAL EXAM:  VITAL SIGNS: ED Triage Vitals  Enc Vitals Group     BP 09/16/17 2128 (!) 151/95     Pulse Rate 09/16/17 2128 78     Resp 09/16/17 2128 18     Temp 09/16/17 2128 98.1 F (36.7 C)     Temp Source 09/16/17 2128 Oral     SpO2 09/16/17 2128 98 %     Weight 09/16/17 2128 200 lb (90.7 kg)     Height 09/16/17 2128 5\' 5"  (1.651 m)     Head Circumference --      Peak Flow --      Pain Score 09/16/17 2127 10     Pain Loc --      Pain Edu? --      Excl. in GC? --     Constitutional: Alert and oriented. Well appearing and in no acute distress. Eyes: Conjunctiva clear Mouth/Throat: Airway patent Periodontal Exam    Hematological/Lymphatic/Immunilogical: No palpable anterior cervical nodes  Respiratory: Respirations are even and unlabored. Musculoskeletal: Active ROM observed. Neurologic: Alert and oriented x 4  Skin:  No appreciable facial swelling over the area of dental pain. Psychiatric: Somnolent   ____________________________________________   LABS (all labs ordered are listed, but only abnormal results are displayed)  Labs Reviewed - No data to display ____________________________________________   RADIOLOGY  Not indicated.  ____________________________________________   PROCEDURES  Procedure(s) performed: None  Critical Care  performed: No ____________________________________________   INITIAL IMPRESSION / ASSESSMENT AND PLAN / ED COURSE  Calvin Wu is a 32 y.o. male who presents to the emergency department for pain control related to dental pain. He was sleeping soundly upon entering the room for the exam and fell back to sleep during. I do not appreciate facial swelling, although he does have a rather thick beard that may hide a slight area of swelling. He is tender over the mandible where the tooth appears to have been broken  for a long while. He is on Suboxone and is somnolent, therefore he will only receive toradol injection and prescription for pain. He was again given a list of dentists and strongly advised to start trying to schedule an appointment.  Pertinent labs & imaging results that were available during my care of the patient were reviewed by me and considered in my medical decision making (see chart for details).  ____________________________________________   FINAL CLINICAL IMPRESSION(S) / ED DIAGNOSES  Final diagnoses:  Dental infection    New Prescriptions   KETOROLAC (TORADOL) 10 MG TABLET    Take 1 tablet (10 mg total) by mouth every 6 (six) hours as needed.    If controlled substance prescribed during this visit, 12 month history viewed on the [place]S[place] prior to issuing an initial prescription for Schedule II or III opiod.  Note:  This document was prepared using Dragon voice recognition software and may include unintentional dictation errors.     Chinita Pester, FNP 09/16/17 2340    Willy Eddy, MD 09/16/17 [phone]

## 2019-05-31 ENCOUNTER — Other Ambulatory Visit: Payer: Self-pay

## 2019-05-31 ENCOUNTER — Encounter: Payer: Self-pay | Admitting: Emergency Medicine

## 2019-05-31 ENCOUNTER — Emergency Department: Payer: Self-pay

## 2019-05-31 ENCOUNTER — Emergency Department
Admission: EM | Admit: 2019-05-31 | Discharge: 2019-05-31 | Disposition: A | Discharge status: Home / Self Care | Payer: Self-pay | Attending: Emergency Medicine | Admitting: Emergency Medicine

## 2019-05-31 DIAGNOSIS — Z79899 Other long term (current) drug therapy: Secondary | ICD-10-CM | POA: Insufficient documentation

## 2019-05-31 DIAGNOSIS — Y92008 Other place in unspecified non-institutional (private) residence as the place of occurrence of the external cause: Secondary | ICD-10-CM | POA: Insufficient documentation

## 2019-05-31 DIAGNOSIS — M542 Cervicalgia: Secondary | ICD-10-CM

## 2019-05-31 DIAGNOSIS — Y999 Unspecified external cause status: Secondary | ICD-10-CM | POA: Insufficient documentation

## 2019-05-31 DIAGNOSIS — Y9389 Activity, other specified: Secondary | ICD-10-CM | POA: Insufficient documentation

## 2019-05-31 DIAGNOSIS — G8929 Other chronic pain: Secondary | ICD-10-CM | POA: Insufficient documentation

## 2019-05-31 DIAGNOSIS — F17200 Nicotine dependence, unspecified, uncomplicated: Secondary | ICD-10-CM | POA: Insufficient documentation

## 2019-05-31 DIAGNOSIS — M436 Torticollis: Secondary | ICD-10-CM | POA: Insufficient documentation

## 2019-05-31 DIAGNOSIS — X509XXA Other and unspecified overexertion or strenuous movements or postures, initial encounter: Secondary | ICD-10-CM | POA: Insufficient documentation

## 2019-05-31 HISTORY — DX: Cervicalgia: M54.2

## 2019-05-31 MED ORDER — METHOCARBAMOL 500 MG PO TABS: ORAL_TABLET | ORAL | 0 refills | Status: DC

## 2019-05-31 MED ORDER — OXYCODONE-ACETAMINOPHEN 7.5-325 MG PO TABS: 1.0000 | ORAL_TABLET | Freq: Four times a day (QID) | ORAL | 0 refills | Status: AC | PRN

## 2019-05-31 MED ORDER — PREDNISONE 10 MG PO TABS: ORAL_TABLET | ORAL | 0 refills | Status: DC

## 2019-05-31 MED ORDER — FENTANYL CITRATE (PF) 100 MCG/2ML IJ SOLN
50.0000 ug | Freq: Once | INTRAMUSCULAR | Status: AC
  Administered 2019-05-31: 50 ug via INTRAMUSCULAR
  Filled 2019-05-31: qty 2

## 2019-05-31 MED ORDER — METHOCARBAMOL 500 MG PO TABS
1000.0000 mg | ORAL_TABLET | Freq: Once | ORAL | Status: AC
  Administered 2019-05-31: 13:00:00 1000 mg via ORAL
  Filled 2019-05-31: qty 2

## 2019-05-31 MED ORDER — OXYCODONE-ACETAMINOPHEN 7.5-325 MG PO TABS
1.0000 | ORAL_TABLET | Freq: Once | ORAL | Status: AC
  Administered 2019-05-31: 13:00:00 1 via ORAL
  Filled 2019-05-31: qty 1

## 2019-05-31 NOTE — Discharge Instructions (Signed)
Follow-up with your primary care provider or if not improving Dr. Sabra Heck who is on-call for orthopedics.  You may also make an appointment with the surgeon who did your last neck surgery if that is preferred.  Take medication only as directed.  The pain medication plus the muscle relaxant may cause drowsiness so while taking those medications cannot drive or operate machinery.  Begin taking the prednisone today starting with 3 tablets daily for the next 5 days.  You may use ice or heat to your muscles as needed for discomfort.  Return to the emergency department over the weekend if any severe worsening of your symptoms.

## 2019-05-31 NOTE — ED Provider Notes (Signed)
[facility] Emergency Department Provider Note  ____________________________________________   First MD Initiated Contact with Patient 05/31/19 1306     (approximate)  I have reviewed the triage vital signs and the nursing notes.   HISTORY  Chief Complaint Neck Injury   HPI Calvin Wu is a 33 y.o. male presents to the ED with complaint of neck pain.  Patient states that for approximately 1 week he has continued to have neck pain and decreased range of motion.  Patient states that he was under house last week trying to do some work in a tight space.  He thinks that possibly this caused a reinjury.  He denies any paresthesias to his upper extremities.  He has taken over-the-counter medication without any relief.  He is also used topical creams with no relief.  Patient has a history of fractures to his cervical spine.  He states that no surgery was done and that he wore a halo during that time.  He rates his pain as an 8/10.       Past Medical History:  Diagnosis Date  . Neck pain     Patient Active Problem List   Diagnosis Date Noted  . Cellulitis 12/20/2016  . Cellulitis of arm, right     Past Surgical History:  Procedure Laterality Date  . NECK SURGERY      Prior to Admission medications   Medication Sig Start Date End Date Taking? Authorizing Provider  methocarbamol (ROBAXIN) 500 MG tablet 1 or 2 tablets every 6 hours as needed for muscle spasms 05/31/19   Johnn Hai, PA-C  oxyCODONE-acetaminophen (PERCOCET) 7.5-325 MG tablet Take 1 tablet by mouth every 6 (six) hours as needed for severe pain. 05/31/19 05/30/20  Johnn Hai, PA-C  predniSONE (DELTASONE) 10 MG tablet Take 3 tablets once a day for the next 5 days 05/31/19   Letitia Neri L, PA-C  SUBOXONE 8-2 MG FILM Place 1 Film under the tongue 2 (two) times daily. 12/19/16   [provider]    Allergies Sulfa antibiotics  No family history on file.  Social History  Social History   Tobacco Use  . Smoking status: Current Every Day Smoker  . Smokeless tobacco: Never Used  Substance Use Topics  . Alcohol use: Yes  . 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     Review of Systems Constitutional: No fever/chills Eyes: No visual changes. ENT: No sore throat. Cardiovascular: Denies chest pain. Respiratory: Denies shortness of breath. Gastrointestinal:   No nausea, no vomiting. Musculoskeletal: Negative for back pain.  Positive cervical pain.  No new cervical radiculopathy. Skin: Negative for rash. Neurological: Negative for headaches, focal weakness or numbness. ____________________________________________   PHYSICAL EXAM:  VITAL SIGNS: ED Triage Vitals  Enc Vitals Group     BP 05/31/19 1303 136/86     Pulse Rate 05/31/19 1303 73     Resp --      Temp 05/31/19 1303 98.7 F (37.1 C)     Temp Source 05/31/19 1303 Oral     SpO2 05/31/19 1303 98 %     Weight 05/31/19 1253 210 lb (95.3 kg)     Height 05/31/19 1253 5\' 4"  (1.626 m)     Head Circumference --      Peak Flow --      Pain Score 05/31/19 1253 8  Pain Loc --      Pain Edu? --      Excl. in GC? --     Constitutional: Alert and oriented. Well appearing and in no acute distress. Eyes: Conjunctivae are normal. Head: Atraumatic. Nose: No congestion/rhinnorhea. Neck: No stridor.  Marked tenderness on very light  palpation of the cervical spine posteriorly.  Also paravertebral muscles bilaterally are tender to palpation.  Range of motion is severely restricted due to pain.  Patient guarding against movement. Hematological/Lymphatic/Immunilogical: No cervical lymphadenopathy. Cardiovascular: Normal rate, regular rhythm. Grossly normal heart sounds.  Good peripheral circulation. Respiratory: Normal respiratory effort.  No retractions. Lungs CTAB. Musculoskeletal: Nontender thoracic and lumbar spine on  palpation.  Patient is ambulatory without any assistance.  Patient moves upper and lower extremities with any difficulty. Neurologic:  Normal speech and language. No gross focal neurologic deficits are appreciated. No gait instability. Skin:  Skin is warm, dry and intact. No rash noted. Psychiatric: Mood and affect are normal. Speech and behavior are normal.  ____________________________________________   LABS (all labs ordered are listed, but only abnormal results are displayed)  Labs Reviewed - No data to display  RADIOLOGY  Official radiology report(s): Ct Cervical Spine Wo Contrast  Result Date: 05/31/2019 CLINICAL DATA:  Pt reports pain to neck for the past week. Pt reports hx of injuries and problems with his neck. Pt reports was working under a house last week and thinks he injured it then. Pt states has to turn his whole upper body to look left or right. EXAM: CT CERVICAL SPINE WITHOUT CONTRAST TECHNIQUE: Multidetector CT imaging of the cervical spine was performed without intravenous contrast. Multiplanar CT image reconstructions were also generated. COMPARISON:  None. FINDINGS: Alignment: Straightened cervical lordosis. No subluxations/spondylolisthesis. Skull base and vertebrae: No acute fracture. No primary bone lesion or focal pathologic process. Soft tissues and spinal canal: No prevertebral fluid or swelling. No visible canal hematoma. Disc levels: Discs are relatively well maintained in height. No evidence of a disc herniation. No significant disc bulging mild neural foraminal narrowing on the left at C3-C4 from minor uncovertebral and facet spurring. No other stenosis. Upper chest: No acute findings. No masses or adenopathy. Clear lung apices. Other: None. IMPRESSION: 1. No fracture, spondylolisthesis or acute finding.  No bone lesion. 2. Straightened cervical lordosis. 3. Mild neural foraminal narrowing on the left at C3-C4 due to minor uncovertebral and facet spurring. No other  degenerative change. No other abnormalities. Electronically Signed   By: Amie Portlandavid  Ormond M.D.   On: 05/31/2019 14:12    ____________________________________________   PROCEDURES  Procedure(s) performed (including Critical Care):  Procedures   ____________________________________________   INITIAL IMPRESSION / ASSESSMENT AND PLAN / ED COURSE  As part of my medical decision making, I reviewed the following data within the electronic MEDICAL RECORD NUMBER Notes from prior ED visits and [place] Controlled Substance Database  33 year old male presents to the ED with complaint of neck pain for approximately 1 week.  Patient states that he has had a history of cervical fractures x2 and has had problems with his neck.  1 week ago he was under house doing some work when he got into a tight spot and was unable to clamp what he needed stating that he twisted and had pain.  He denies any new radiculopathy in his upper extremities.  Patient range of motion is markedly restricted secondary to pain and discomfort.  While in the ED patient was given Percocet 7.5 and methocarbamol 1000 mg. p.o.  He was also given fentanyl 50 mg IM prior to discharge.  Patient was discharged with a prescription for Percocet 7.5 every 6 hours for pain, methocarbamol 1 or 2 tablets every 6 hours as needed for muscle spasms and prednisone.  He is to follow-up with Dr. Hyacinth Meeker if any continued problems or make an appointment with the orthopedist who did his surgery.  ____________________________________________   FINAL CLINICAL IMPRESSION(S) / ED DIAGNOSES  Final diagnoses:  Torticollis, acute  Chronic neck pain     ED Discharge Orders         Ordered    oxyCODONE-acetaminophen (PERCOCET) 7.5-325 MG tablet  Every 6 hours PRN     05/31/19 1440    methocarbamol (ROBAXIN) 500 MG tablet     05/31/19 1440    predniSONE (DELTASONE) 10 MG tablet     05/31/19 1440           Note:  This document was prepared using Dragon voice  recognition software and may include unintentional dictation errors.    Tommi Rumps, PA-C 05/31/19 1513    Emily Filbert, MD 06/01/19 [phone]

## 2019-05-31 NOTE — ED Triage Notes (Signed)
Pt reports pain to neck for the past week. Pt reports hx of injuries and problems with his neck. Pt reports was working under a house last week and thinks he injured it then. Pt states has to turn his whole upper body to look left or right.

## 2020-03-06 ENCOUNTER — Other Ambulatory Visit: Payer: Self-pay

## 2020-03-06 ENCOUNTER — Emergency Department: Payer: Self-pay

## 2020-03-06 ENCOUNTER — Emergency Department
Admission: EM | Admit: 2020-03-06 | Discharge: 2020-03-06 | Disposition: A | Discharge status: Home / Self Care | Payer: Self-pay | Attending: Emergency Medicine | Admitting: Emergency Medicine

## 2020-03-06 DIAGNOSIS — Z7952 Long term (current) use of systemic steroids: Secondary | ICD-10-CM | POA: Insufficient documentation

## 2020-03-06 DIAGNOSIS — S161XXA Strain of muscle, fascia and tendon at neck level, initial encounter: Secondary | ICD-10-CM | POA: Insufficient documentation

## 2020-03-06 DIAGNOSIS — F17200 Nicotine dependence, unspecified, uncomplicated: Secondary | ICD-10-CM | POA: Insufficient documentation

## 2020-03-06 DIAGNOSIS — Y939 Activity, unspecified: Secondary | ICD-10-CM | POA: Insufficient documentation

## 2020-03-06 DIAGNOSIS — S39012A Strain of muscle, fascia and tendon of lower back, initial encounter: Secondary | ICD-10-CM | POA: Insufficient documentation

## 2020-03-06 DIAGNOSIS — X500XXA Overexertion from strenuous movement or load, initial encounter: Secondary | ICD-10-CM | POA: Insufficient documentation

## 2020-03-06 DIAGNOSIS — Y929 Unspecified place or not applicable: Secondary | ICD-10-CM | POA: Insufficient documentation

## 2020-03-06 DIAGNOSIS — Y999 Unspecified external cause status: Secondary | ICD-10-CM | POA: Insufficient documentation

## 2020-03-06 MED ORDER — CYCLOBENZAPRINE HCL 10 MG PO TABS
10.0000 mg | ORAL_TABLET | Freq: Once | ORAL | Status: AC
  Administered 2020-03-06: 10 mg via ORAL
  Filled 2020-03-06: qty 1

## 2020-03-06 MED ORDER — CYCLOBENZAPRINE HCL 5 MG PO TABS: 5.0000 mg | ORAL_TABLET | Freq: Three times a day (TID) | ORAL | 0 refills | Status: DC | PRN

## 2020-03-06 MED ORDER — PREDNISONE 10 MG PO TABS
10.0000 mg | ORAL_TABLET | Freq: Every day | ORAL | 0 refills | Status: DC
Start: 2020-03-06 — End: 2020-09-29

## 2020-03-06 MED ORDER — OXYCODONE-ACETAMINOPHEN 5-325 MG PO TABS
2.0000 | ORAL_TABLET | Freq: Once | ORAL | Status: AC
  Administered 2020-03-06: 2 via ORAL
  Filled 2020-03-06: qty 2

## 2020-03-06 MED ORDER — HYDROCODONE-ACETAMINOPHEN 5-325 MG PO TABS: 1.0000 | ORAL_TABLET | Freq: Four times a day (QID) | ORAL | 0 refills | Status: DC | PRN

## 2020-03-06 NOTE — ED Triage Notes (Signed)
Pt arrives via POV for neck and back pain. Pt reports "my neck and my back are all messed up". Pt reports a hx of neck injuries 10 years ago but reports pain is worsening over the last several weeks. Pt to triage via wheelchair and states he is unable to walk very far. NAD noted. Skin warm and dry

## 2020-03-06 NOTE — Discharge Instructions (Signed)
Please take medications as prescribed.  Return to the ER for any increasing pain worsening symptoms or urgent changes in your health.  Follow-up with primary care provider or orthopedist in 1 week if no improvement.

## 2020-03-06 NOTE — ED Provider Notes (Signed)
[facility] EMERGENCY DEPARTMENT Provider Note   CSN: [id number] Arrival date & time: 03/06/20  1556     History Chief Complaint  Patient presents with  . Neck Pain  . Back Pain    Calvin Wu is a 34 y.o. male presents to the emergency department for evaluation of acute midline low back pain and neck pain. Patient states yesterday he was trying to get a lawnmower out of a ditch, pulling and tugging he felt pain in his neck and lower back. He describes tightness and spasms. Has a hard time standing feels as if his lower back is locking up. He also feels similar tightness along the left and right side of the cervical spine. No numbness tingling radicular symptoms in the upper or lower extremities. No loss of bowel or bladder symptoms. Pain is increased with walking twisting and bending. He denies any chest pain shortness of breath or abdominal pain. No fevers.  HPI     Past Medical History:  Diagnosis Date  . Neck pain     Patient Active Problem List   Diagnosis Date Noted  . Cellulitis 12/20/2016  . Cellulitis of arm, right     Past Surgical History:  Procedure Laterality Date  . NECK SURGERY         History reviewed. No pertinent family history.  Social History   Tobacco Use  . Smoking status: Current Every Day Smoker  . Smokeless tobacco: Never Used  Substance Use Topics  . Alcohol use: Yes  . 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     Home Medications Prior to Admission medications   Medication Sig Start Date End Date Taking? Authorizing Provider  cyclobenzaprine (FLEXERIL) 5 MG tablet Take 1-2 tablets (5-10 mg total) by mouth 3 (three) times daily as needed for muscle spasms. 03/06/20   Evon Slack, PA-C  HYDROcodone-acetaminophen (NORCO) 5-325 MG tablet Take 1 tablet by mouth every 6 (six) hours as needed for moderate pain. 03/06/20   Evon Slack, PA-C  methocarbamol (ROBAXIN) 500 MG tablet 1 or 2 tablets every 6 hours as needed for muscle spasms 05/31/19   Tommi Rumps, PA-C  oxyCODONE-acetaminophen (PERCOCET) 7.5-325 MG tablet Take 1 tablet by mouth every 6 (six) hours as needed for severe pain. 05/31/19 05/30/20  Tommi Rumps, PA-C  predniSONE (DELTASONE) 10 MG tablet Take 1 tablet (10 mg total) by mouth daily. 6,5,4,3,2,1 six day taper 03/06/20   Amador Cunas C, PA-C  SUBOXONE 8-2 MG FILM Place 1 Film under the tongue 2 (two) times daily. 12/19/16   [provider]    Allergies    Sulfa antibiotics  Review of Systems   Review of Systems  Constitutional: Negative for chills, diaphoresis, fatigue and fever.  HENT: Negative for trouble swallowing.   Respiratory: Negative for shortness of breath.   Cardiovascular: Negative for chest pain.  Gastrointestinal: Negative for abdominal pain, diarrhea, nausea and vomiting.  Musculoskeletal: Positive for back pain and neck pain. Negative for gait problem, joint swelling, myalgias and neck stiffness.  Skin: Negative for rash and wound.  Neurological: Negative for dizziness and headaches.    Physical Exam Updated Vital Signs BP 133/77 (BP Location: Right Arm)   Pulse 73   Temp 98.3 F (36.8 C) (Oral)   Resp 18   Ht 5\' 4"  (1.626 m)   Wt 86.2 kg  SpO2 97%   BMI 32.61 kg/m   Physical Exam Constitutional:      Appearance: He is well-developed.  HENT:     Head: Normocephalic and atraumatic.  Eyes:     Conjunctiva/sclera: Conjunctivae normal.  Cardiovascular:     Rate and Rhythm: Normal rate.  Pulmonary:     Effort: Pulmonary effort is normal. No respiratory distress.  Abdominal:     General: Abdomen is flat. There is no distension.     Palpations: Abdomen is soft.     Tenderness: There is no abdominal tenderness. There is no guarding.  Musculoskeletal:        General: Normal range of motion.     Cervical back: Normal range of motion and neck supple. No  rigidity.     Comments: Examination of the cervical thoracic and lumbar spine shows no spinous process tenderness.  Patient with left and right paravertebral muscle tenderness along the cervical paravertebral muscles, trapezius bilaterally.  Full upper extremity range of motion with no neurological deficits.  Patient with left and right paravertebral muscle tenderness along the paravertebral muscles of the lumbar spine with no sacral or SI joint tenderness.  Patient has pain with flexion and extension.  Good range of motion of both hips with no discomfort.  Good strength with quad resistance, ankle plantarflexion dorsiflexion resistance.  Patellar reflexes are normal bilaterally.  No clonus noted.  No sensation loss or saddle anesthesia  Skin:    General: Skin is warm.     Findings: No rash.  Neurological:     General: No focal deficit present.     Mental Status: He is alert and oriented to person, place, and time.  Psychiatric:        Behavior: Behavior normal.        Thought Content: Thought content normal.     ED Results / Procedures / Treatments   Labs (all labs ordered are listed, but only abnormal results are displayed) Labs Reviewed - No data to display  EKG None  Radiology DG Cervical Spine Complete  Result Date: 03/06/2020 CLINICAL DATA:  Neck and back pain EXAM: CERVICAL SPINE - COMPLETE 4+ VIEW COMPARISON:  CT 05/31/2019 FINDINGS: Suboptimal visualization of cervicothoracic junction. Vertebral body heights are normal. Disc spaces are within normal limits. Dens and lateral masses are within normal limits. Bilateral foraminal narrowing at C3-C4 and C4-C5. IMPRESSION: 1. No acute osseous abnormality. 2. Suspected foraminal narrowing at C3-C4 and C4-C5. Electronically Signed   By: Jasmine Pang M.D.   On: 03/06/2020 17:59   X-rays lumbar spine reviewed by me today from 03/06/2020 show L4 compression fracture that is unchanged from previous imaging in 2007.  No spondylolisthesis.  Mild  lower lumbar spondylosis.  Procedures Procedures (including critical care time)  Medications Ordered in ED Medications  oxyCODONE-acetaminophen (PERCOCET/ROXICET) 5-325 MG per tablet 2 tablet (2 tablets Oral Given 03/06/20 1635)  cyclobenzaprine (FLEXERIL) tablet 10 mg (10 mg Oral Given 03/06/20 1635)    ED Course  I have reviewed the triage vital signs and the nursing notes.  Pertinent labs & imaging results that were available during my care of the patient were reviewed by me and considered in my medical decision making (see chart for details).    MDM Rules/Calculators/A&P                          34 year old male with acute lumbar and cervical strain after lifting a lawn mower.  Patient given  prescription for Norco, Flexeril and prednisone.  He will avoid any physical activity for the next couple days.  He will perform gentle stretching exercises.  He understands signs symptoms return to the ED for. Final Clinical Impression(s) / ED Diagnoses Final diagnoses:  Acute strain of neck muscle, initial encounter  Acute myofascial strain of lumbar region, initial encounter    Rx / DC Orders ED Discharge Orders         Ordered    cyclobenzaprine (FLEXERIL) 5 MG tablet  3 times daily PRN     Discontinue  Reprint     03/06/20 1905    predniSONE (DELTASONE) 10 MG tablet  Daily     Discontinue  Reprint     03/06/20 1905    HYDROcodone-acetaminophen (NORCO) 5-325 MG tablet  Every 6 hours PRN     Discontinue  Reprint     03/06/20 1905           Ronnette Juniper 03/06/20 1908    Arnaldo Natal, MD 03/07/20 [phone]

## 2020-03-06 NOTE — ED Notes (Signed)
Pt states that he has neck pain and lower back pain from trying to get a lawn mower out of the ditch at work.

## 2020-03-18 ENCOUNTER — Other Ambulatory Visit: Payer: Self-pay

## 2020-03-18 ENCOUNTER — Emergency Department: Payer: Self-pay

## 2020-03-18 ENCOUNTER — Encounter: Payer: Self-pay | Admitting: Emergency Medicine

## 2020-03-18 ENCOUNTER — Emergency Department
Admission: EM | Admit: 2020-03-18 | Discharge: 2020-03-18 | Disposition: A | Discharge status: Home / Self Care | Payer: Self-pay | Attending: Emergency Medicine | Admitting: Emergency Medicine

## 2020-03-18 DIAGNOSIS — Z79899 Other long term (current) drug therapy: Secondary | ICD-10-CM | POA: Insufficient documentation

## 2020-03-18 DIAGNOSIS — F17210 Nicotine dependence, cigarettes, uncomplicated: Secondary | ICD-10-CM | POA: Insufficient documentation

## 2020-03-18 DIAGNOSIS — R1032 Left lower quadrant pain: Secondary | ICD-10-CM | POA: Insufficient documentation

## 2020-03-18 DIAGNOSIS — L03314 Cellulitis of groin: Secondary | ICD-10-CM | POA: Insufficient documentation

## 2020-03-18 LAB — CBC
HCT: 41.1 % (ref 39.0–52.0)
Hemoglobin: 14.6 g/dL (ref 13.0–17.0)
MCH: 31.4 pg (ref 26.0–34.0)
MCHC: 35.5 g/dL (ref 30.0–36.0)
MCV: 88.4 fL (ref 80.0–100.0)
Platelets: 268 10*3/uL (ref 150–400)
RBC: 4.65 MIL/uL (ref 4.22–5.81)
RDW: 12.8 % (ref 11.5–15.5)
WBC: 15.5 10*3/uL — ABNORMAL HIGH (ref 4.0–10.5)
nRBC: 0 % (ref 0.0–0.2)

## 2020-03-18 LAB — BASIC METABOLIC PANEL
Anion gap: 9 (ref 5–15)
BUN: 14 mg/dL (ref 6–20)
CO2: 30 mmol/L (ref 22–32)
Calcium: 9 mg/dL (ref 8.9–10.3)
Chloride: 98 mmol/L (ref 98–111)
Creatinine, Ser: 1.09 mg/dL (ref 0.61–1.24)
GFR calc Af Amer: >60 mL/min (ref >60)
GFR calc non Af Amer: >60 mL/min (ref >60)
Glucose, Bld: 91 mg/dL (ref 70–99)
Potassium: 3.7 mmol/L (ref 3.5–5.1)
Sodium: 137 mmol/L (ref 135–145)

## 2020-03-18 MED ORDER — DOXYCYCLINE HYCLATE 100 MG PO TABS
100.0000 mg | ORAL_TABLET | Freq: Once | ORAL | Status: AC
  Administered 2020-03-18: 100 mg via ORAL
  Filled 2020-03-18: qty 1

## 2020-03-18 MED ORDER — HYDROCODONE-ACETAMINOPHEN 5-325 MG PO TABS: 1.0000 | ORAL_TABLET | ORAL | 0 refills | Status: DC | PRN

## 2020-03-18 MED ORDER — LIDOCAINE HCL (PF) 1 % IJ SOLN
5.0000 mL | Freq: Once | INTRAMUSCULAR | Status: AC
  Administered 2020-03-18: 5 mL
  Filled 2020-03-18: qty 5

## 2020-03-18 MED ORDER — HYDROMORPHONE HCL 1 MG/ML IJ SOLN
1.0000 mg | Freq: Once | INTRAMUSCULAR | Status: AC
  Administered 2020-03-18: 1 mg via INTRAVENOUS
  Filled 2020-03-18: qty 1

## 2020-03-18 MED ORDER — IOHEXOL 300 MG/ML  SOLN
100.0000 mL | Freq: Once | INTRAMUSCULAR | Status: AC | PRN
  Administered 2020-03-18: 100 mL via INTRAVENOUS
  Filled 2020-03-18: qty 100

## 2020-03-18 MED ORDER — DOXYCYCLINE MONOHYDRATE 100 MG PO TABS: 100.0000 mg | ORAL_TABLET | Freq: Two times a day (BID) | ORAL | 0 refills | Status: AC

## 2020-03-18 NOTE — ED Notes (Signed)
Pt aware of need for urine sample, will provide one when he is able to, he reports

## 2020-03-18 NOTE — Discharge Instructions (Addendum)
Please take antibiotic as prescribed for cellulitis. Take pain medication as needed for pain. You may also take ibuprofen 600 mg every 6 hours with food for additional pain relief. Follow-up with primary care provider in 2 to 3 days for recheck. Return to the ER for any fevers increasing pain, swelling, worsening symptoms or urgent changes in your health.

## 2020-03-18 NOTE — ED Notes (Signed)
Pt to CT

## 2020-03-18 NOTE — ED Provider Notes (Signed)
[facility] EMERGENCY DEPARTMENT Provider Note   CSN: [id number] Arrival date & time: 03/18/20  1936     History Chief Complaint  Patient presents with  . Abscess    Calvin Wu is a 34 y.o. male presents to the emergency department for evaluation of left groin pain.  Pain has been present for 2 days.  He states he started out with a little bit of mild swelling but over the last 2 days swelling has grown and become more painful.  Pain is moderate to severe.  He denies any fevers.  No drainage.  No penile discharge or penile lesions.  No history of STDs or recent new sexual partners.  He denies any abdominal pain, testicular pain or scrotal swelling.  HPI     Past Medical History:  Diagnosis Date  . Neck pain     Patient Active Problem List   Diagnosis Date Noted  . Cellulitis 12/20/2016  . Cellulitis of arm, right     Past Surgical History:  Procedure Laterality Date  . NECK SURGERY         No family history on file.  Social History   Tobacco Use  . Smoking status: Current Every Day Smoker  . Smokeless tobacco: Never Used  Substance Use Topics  . Alcohol use: Yes  . 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     Home Medications Prior to Admission medications   Medication Sig Start Date End Date Taking? Authorizing Provider  cyclobenzaprine (FLEXERIL) 5 MG tablet Take 1-2 tablets (5-10 mg total) by mouth 3 (three) times daily as needed for muscle spasms. 03/06/20   Evon Slack, PA-C  doxycycline (ADOXA) 100 MG tablet Take 1 tablet (100 mg total) by mouth 2 (two) times daily for 21 days. 03/18/20 04/08/20  Evon Slack, PA-C  HYDROcodone-acetaminophen (NORCO) 5-325 MG tablet Take 1 tablet by mouth every 4 (four) hours as needed for moderate pain. 03/18/20   Evon Slack, PA-C  methocarbamol (ROBAXIN) 500 MG tablet 1 or 2 tablets every 6 hours as  needed for muscle spasms 05/31/19   Tommi Rumps, PA-C  oxyCODONE-acetaminophen (PERCOCET) 7.5-325 MG tablet Take 1 tablet by mouth every 6 (six) hours as needed for severe pain. 05/31/19 05/30/20  Tommi Rumps, PA-C  predniSONE (DELTASONE) 10 MG tablet Take 1 tablet (10 mg total) by mouth daily. 6,5,4,3,2,1 six day taper 03/06/20   Amador Cunas C, PA-C  SUBOXONE 8-2 MG FILM Place 1 Film under the tongue 2 (two) times daily. 12/19/16   [provider]    Allergies    Sulfa antibiotics  Review of Systems   Review of Systems  Constitutional: Negative for chills and fever.  Gastrointestinal: Negative for nausea and vomiting.  Genitourinary: Negative for dysuria, genital sores, penile swelling and scrotal swelling.  Skin: Positive for wound.    Physical Exam Updated Vital Signs BP 100/63   Pulse 86   Temp 98.4 F (36.9 C) (Oral)   Resp 16   Ht 5\' 3"  (1.6 m)   Wt 81.6 kg   SpO2 95%   BMI 31.89 kg/m   Physical Exam Constitutional:      Appearance: He is well-developed.  HENT:     Head: Normocephalic and atraumatic.  Eyes:     Conjunctiva/sclera: Conjunctivae normal.  Cardiovascular:     Rate and Rhythm: Normal  rate.  Pulmonary:     Effort: Pulmonary effort is normal. No respiratory distress.  Abdominal:     General: There is no distension.     Palpations: Abdomen is soft.     Tenderness: There is no abdominal tenderness. There is no guarding.  Musculoskeletal:     Cervical back: Normal range of motion.     Comments: Left groin on the suprapubic region with a 6 cm area of swelling, redness and induration. No definite fluctuance. Area of induration is tender to palpation. No penile lesions/discharge or scrotal swelling, warmth redness or tenderness.  No abdominal tenderness.  Skin:    General: Skin is warm.     Findings: No rash.  Neurological:     Mental Status: He is alert and oriented to person, place, and time.  Psychiatric:        Behavior: Behavior  normal.        Thought Content: Thought content normal.     ED Results / Procedures / Treatments   Labs (all labs ordered are listed, but only abnormal results are displayed) Labs Reviewed  CBC - Abnormal; Notable for the following components:      Result Value   WBC 15.5 (*)    All other components within normal limits  BASIC METABOLIC PANEL    EKG None  Radiology CT PELVIS W CONTRAST  Result Date: 03/18/2020 CLINICAL DATA:  Groin abscess, inguinal swelling, pelvic lymphadenopathy EXAM: CT PELVIS WITH CONTRAST TECHNIQUE: Multidetector CT imaging of the pelvis was performed using the standard protocol following the bolus administration of intravenous contrast. CONTRAST:  OMNIPAQUE IOHEXOL 300 MG/ML  SOLN COMPARISON:  03/14/2008 FINDINGS: Urinary Tract:  The bladder is unremarkable Bowel: Mild sigmoid diverticulosis. The visualized large and small bowel are otherwise unremarkable. No free intraperitoneal fluid. Vascular/Lymphatic: The pelvic vasculature is unremarkable. Shotty bilateral inguinal adenopathy is seen, however, there is no frankly pathologic adenopathy identified within the pelvis. Reproductive: There is moderate subcutaneous inflammatory stranding involving the base of the penis and extending superiorly into the subcutaneous fat. The findings are nonspecific, but can be seen in the setting of trauma or inflammation as can be seen with localized cellulitis. There is no discrete drainable subcutaneous fluid collection identified. The inflammatory changes do not extend into the a deep tissues of the pelvis itself. Prostate gland and seminal vesicles are unremarkable. Other:  None significant Musculoskeletal: Unremarkable IMPRESSION: Subcutaneous inflammatory stranding involving the base of the penis and extending superiorly into the subcutaneous fat anterior to the pubic symphysis. No discrete drainable fluid collection identified. No intrapelvic extension of the inflammatory  process identified. Electronically Signed   By: Helyn Numbers MD   On: 03/18/2020 23:03    Procedures Procedures (including critical care time)  Medications Ordered in ED Medications  doxycycline (VIBRA-TABS) tablet 100 mg (has no administration in time range)  HYDROmorphone (DILAUDID) injection 1 mg (1 mg Intravenous Given 03/18/20 2159)  lidocaine (PF) (XYLOCAINE) 1 % injection 5 mL (5 mLs Infiltration Given 03/18/20 2316)  iohexol (OMNIPAQUE) 300 MG/ML solution 100 mL (100 mLs Intravenous Contrast Given 03/18/20 2246)    ED Course  I have reviewed the triage vital signs and the nursing notes.  Pertinent labs & imaging results that were available during my care of the patient were reviewed by me and considered in my medical decision making (see chart for details).    MDM Rules/Calculators/A&P  34 year old male with soft tissue swelling along the suprapubic region, vital signs stable, afebrile. Labs showed an elevated white count of 15.5. Patient denies any STD symptoms. Differential includes cellulitis versus abscess or lymphogranuloma venereum. CT obtained showing no drainable fluid collection. Patient treated with doxycycline 100 mg twice daily for 3 weeks. I am concerned for possible lymphogranuloma venereum, patient will follow up with PCP for recheck. He understands signs and symptoms to return to the ER for. I requested a urine GC chlamydia test but patient refused.   Final Clinical Impression(s) / ED Diagnoses Final diagnoses:  Left groin pain  Cellulitis of groin    Rx / DC Orders ED Discharge Orders         Ordered    doxycycline (ADOXA) 100 MG tablet  2 times daily     Discontinue  Reprint     03/18/20 2331    HYDROcodone-acetaminophen (NORCO) 5-325 MG tablet  Every 4 hours PRN     Discontinue  Reprint     03/18/20 2331           Evon Slack, PA-C 03/18/20 Ouida Sills    Chesley Noon, MD 03/18/20 2349

## 2020-03-18 NOTE — ED Triage Notes (Signed)
Patient ambulatory to triage with steady gait, without difficulty or distress noted; pt reports abscess to left groin x 2 days with hx of same

## 2020-03-18 NOTE — ED Notes (Signed)
Pt reports abscess that came up 2 days ago in left groin that is causing pain

## 2020-09-29 ENCOUNTER — Encounter: Payer: Self-pay | Admitting: Emergency Medicine

## 2020-09-29 ENCOUNTER — Emergency Department
Admission: EM | Admit: 2020-09-29 | Discharge: 2020-09-29 | Disposition: A | Discharge status: Home / Self Care | Payer: Self-pay | Attending: Student in an Organized Health Care Education/Training Program | Admitting: Student in an Organized Health Care Education/Training Program

## 2020-09-29 ENCOUNTER — Telehealth: Payer: Self-pay | Admitting: Emergency Medicine

## 2020-09-29 ENCOUNTER — Other Ambulatory Visit: Payer: Self-pay

## 2020-09-29 DIAGNOSIS — R059 Cough, unspecified: Secondary | ICD-10-CM | POA: Insufficient documentation

## 2020-09-29 DIAGNOSIS — R0981 Nasal congestion: Secondary | ICD-10-CM | POA: Insufficient documentation

## 2020-09-29 DIAGNOSIS — H66002 Acute suppurative otitis media without spontaneous rupture of ear drum, left ear: Secondary | ICD-10-CM | POA: Insufficient documentation

## 2020-09-29 DIAGNOSIS — Z1152 Encounter for screening for COVID-19: Secondary | ICD-10-CM

## 2020-09-29 DIAGNOSIS — F17210 Nicotine dependence, cigarettes, uncomplicated: Secondary | ICD-10-CM | POA: Insufficient documentation

## 2020-09-29 MED ORDER — AMOXICILLIN 875 MG PO TABS: 875.0000 mg | ORAL_TABLET | Freq: Two times a day (BID) | ORAL | 0 refills | Status: DC

## 2020-09-29 MED ORDER — AMOXICILLIN 875 MG PO TABS
875.0000 mg | ORAL_TABLET | Freq: Two times a day (BID) | ORAL | 0 refills | Status: DC
Start: 2020-09-29 — End: 2020-09-29

## 2020-09-29 NOTE — ED Triage Notes (Signed)
Patient to ER for c/o bilateral ear pain and sore throat since last week. Patient also reports nasal congestion.

## 2020-09-29 NOTE — ED Notes (Signed)
Patient verbalizes understanding of discharge instructions. Opportunity for questioning and answers were provided. Armband removed by staff, pt discharged from ED. Ambulated out to lobby  

## 2020-09-29 NOTE — ED Provider Notes (Signed)
[facility] Emergency Department Provider Note   ____________________________________________   Event Date/Time   First MD Initiated Contact with Patient 09/29/20 0915     (approximate)  I have reviewed the triage vital signs and the nursing notes.   HISTORY  Chief Complaint Otalgia and Sore Throat   HPI Calvin Wu is a 35 y.o. male presents to the ED with complaint of bilateral ear pain with the left being worse than the right and sore throat that started last week.  Patient also reports nasal congestion and cough.  He is unaware of any fever but has had body aches.  Patient continues to smoke 1 pack cigarettes per day and is unvaccinated.  He rates his pain as 4 out of 10.       Past Medical History:  Diagnosis Date  . Neck pain     Patient Active Problem List   Diagnosis Date Noted  . Cellulitis 12/20/2016  . Cellulitis of arm, right     Past Surgical History:  Procedure Laterality Date  . FRACTURE SURGERY     L arm  . NECK SURGERY      Prior to Admission medications   Medication Sig Start Date End Date Taking? Authorizing Provider  amoxicillin (AMOXIL) 875 MG tablet Take 1 tablet (875 mg total) by mouth 2 (two) times daily. 09/29/20  Yes Summers, Rhonda L, PA-C  SUBOXONE 8-2 MG FILM Place 1 Film under the tongue 2 (two) times daily. 12/19/16   [provider]    Allergies Sulfa antibiotics  No family history on file.  Social History Social History   Tobacco Use  . Smoking status: Current Every Day Smoker  . Smokeless tobacco: Never Used  Substance Use Topics  . Alcohol use: Yes  . 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     Review of Systems Constitutional: Subjective fever/chills Eyes: No visual changes. ENT: Positive sore throat.  Positive bilateral ear pain. Cardiovascular: Denies chest pain. Respiratory: Denies shortness  of breath.  Positive cough. Gastrointestinal: No abdominal pain.  No nausea, no vomiting.  No diarrhea.  Musculoskeletal: Positive for muscle aches. Skin: Negative for rash. Neurological: Negative for headaches, focal weakness or numbness. ____________________________________________   PHYSICAL EXAM:  VITAL SIGNS: ED Triage Vitals  Enc Vitals Group     BP 09/29/20 0744 131/69     Pulse Rate 09/29/20 0744 78     Resp 09/29/20 0744 20     Temp 09/29/20 0744 97.8 F (36.6 C)     Temp Source 09/29/20 0744 Oral     SpO2 09/29/20 0744 100 %     Weight 09/29/20 0745 179 lb 14.3 oz (81.6 kg)     Height 09/29/20 0745 5\' 3"  (1.6 m)     Head Circumference --      Peak Flow --      Pain Score 09/29/20 0745 4     Pain Loc --      Pain Edu? --      Excl. in GC? --     Constitutional: Alert and oriented. Well appearing and in no acute distress. Eyes: Conjunctivae are normal.  Head: Atraumatic. Nose: Mild congestion/rhinnorhea.  Right EAC is clear.  TMs are dull with right TM moderate scarring.  Left TM mildly erythematous with poor light reflex. Mouth/Throat: Mucous membranes are moist.  Oropharynx non-erythematous.  No exudate  and uvula is midline. Neck: No stridor.   Cardiovascular: Normal rate, regular rhythm. Grossly normal heart sounds.  Good peripheral circulation. Respiratory: Normal respiratory effort.  No retractions. Lungs CTAB. Gastrointestinal: Soft and nontender. No distention.  Musculoskeletal: Moves upper and lower extremities with any difficulty.  Normal gait was noted. Neurologic:  Normal speech and language. No gross focal neurologic deficits are appreciated.  Skin:  Skin is warm, dry and intact. No rash noted. Psychiatric: Mood and affect are normal. Speech and behavior are normal.  ____________________________________________   LABS (all labs ordered are listed, but only abnormal results are displayed)  Labs Reviewed  SARS CORONAVIRUS 2 (TAT 6-24 HRS)     ____________________________________________   PROCEDURES  Procedure(s) performed (including Critical Care):  Procedures   ____________________________________________   INITIAL IMPRESSION / ASSESSMENT AND PLAN / ED COURSE  As part of my medical decision making, I reviewed the following data within the electronic MEDICAL RECORD NUMBER Notes from prior ED visits and [place] Controlled Substance Database  35 year old male presents to the ED with bilateral ear pain and sore throat for 1 week.  Patient also reports nasal congestion and cough.  He denies known fever but does report body aches.  Patient is unvaccinated and continues to smoke 1 pack cigarettes per day.  A Covid test was obtained.  Patient was made aware that his left TM has fluid along with been mildly erythematous.  Patient is to obtain over-the-counter Mucinex and increase fluids.  He is encouraged take Tylenol or ibuprofen as needed for body aches.  He is aware that his Covid test will take approximately 6 to 24 hours and the results can be seen in my chart.  He also was made aware that he needs to quarantine for 10 days if his Covid test is positive.  A prescription for amoxicillin 875 twice daily for 10 days was sent to the pharmacy for his otitis media.  Patient is to return to the emergency department if any severe worsening of his symptoms.  ____________________________________________   FINAL CLINICAL IMPRESSION(S) / ED DIAGNOSES  Final diagnoses:  Non-recurrent acute suppurative otitis media of left ear without spontaneous rupture of tympanic membrane  Encounter for screening for COVID-19     ED Discharge Orders         Ordered    amoxicillin (AMOXIL) 875 MG tablet  2 times daily        09/29/20 0920          *Please note:  [name] was evaluated in Emergency Department on 09/29/2020 for the symptoms described in the history of present illness. He was evaluated in the context of the global COVID-19  pandemic, which necessitated consideration that the patient might be at risk for infection with the SARS-CoV-2 virus that causes COVID-19. Institutional protocols and algorithms that pertain to the evaluation of patients at risk for COVID-19 are in a state of rapid change based on information released by regulatory bodies including the CDC and federal and state organizations. These policies and algorithms were followed during the patient's care in the ED.  Some ED evaluations and interventions may be delayed as a result of limited staffing during and the pandemic.*   Note:  This document was prepared using Dragon voice recognition software and may include unintentional dictation errors.    Tommi Rumps, PA-C 09/29/20 1055    Willy Eddy, MD 09/29/20 1116

## 2020-09-29 NOTE — Discharge Instructions (Signed)
Follow-up with your primary care provider or Dr. Elenore Rota who is on-call for [place] [facility] if any continued problems with your ear.  Begin taking antibiotics until completely finished for your left ear.  You may obtain Mucinex over-the-counter for congestion.  Increase fluids.  Decrease smoking.  The results of your Covid test can be seen in my chart in approximately 6 to 24 hours.  If your test is positive you will need to quarantine for 10 days since you are not vaccinated.  Tylenol or ibuprofen as needed for ear pain, headache or body aches.

## 2020-10-13 ENCOUNTER — Encounter: Payer: Self-pay | Admitting: Emergency Medicine

## 2020-10-13 ENCOUNTER — Emergency Department
Admission: EM | Admit: 2020-10-13 | Discharge: 2020-10-13 | Disposition: A | Discharge status: Home / Self Care | Payer: Self-pay | Attending: Emergency Medicine | Admitting: Emergency Medicine

## 2020-10-13 ENCOUNTER — Other Ambulatory Visit: Payer: Self-pay

## 2020-10-13 DIAGNOSIS — S025XXA Fracture of tooth (traumatic), initial encounter for closed fracture: Secondary | ICD-10-CM | POA: Insufficient documentation

## 2020-10-13 DIAGNOSIS — K0889 Other specified disorders of teeth and supporting structures: Secondary | ICD-10-CM

## 2020-10-13 DIAGNOSIS — X58XXXA Exposure to other specified factors, initial encounter: Secondary | ICD-10-CM | POA: Insufficient documentation

## 2020-10-13 DIAGNOSIS — K029 Dental caries, unspecified: Secondary | ICD-10-CM

## 2020-10-13 DIAGNOSIS — F17200 Nicotine dependence, unspecified, uncomplicated: Secondary | ICD-10-CM | POA: Insufficient documentation

## 2020-10-13 MED ORDER — LIDOCAINE VISCOUS HCL 2 % MT SOLN
15.0000 mL | Freq: Once | OROMUCOSAL | Status: AC
  Administered 2020-10-13: 15 mL via OROMUCOSAL
  Filled 2020-10-13: qty 15

## 2020-10-13 MED ORDER — IBUPROFEN 600 MG PO TABS: 600.0000 mg | ORAL_TABLET | Freq: Three times a day (TID) | ORAL | 0 refills | Status: DC | PRN

## 2020-10-13 MED ORDER — IBUPROFEN 600 MG PO TABS: 600.0000 mg | ORAL_TABLET | Freq: Once | ORAL | Status: DC

## 2020-10-13 MED ORDER — HYDROCODONE-ACETAMINOPHEN 5-325 MG PO TABS
1.0000 | ORAL_TABLET | Freq: Once | ORAL | Status: AC
  Administered 2020-10-13: 1 via ORAL
  Filled 2020-10-13: qty 1

## 2020-10-13 MED ORDER — HYDROCODONE-ACETAMINOPHEN 5-325 MG PO TABS: 1.0000 | ORAL_TABLET | Freq: Four times a day (QID) | ORAL | 0 refills | Status: DC | PRN

## 2020-10-13 MED ORDER — AMOXICILLIN 500 MG PO CAPS
500.0000 mg | ORAL_CAPSULE | Freq: Three times a day (TID) | ORAL | 0 refills | Status: DC
Start: 2020-10-13 — End: 2020-10-21

## 2020-10-13 MED ORDER — IBUPROFEN 600 MG PO TABS
600.0000 mg | ORAL_TABLET | Freq: Once | ORAL | Status: AC
  Administered 2020-10-13: 600 mg via ORAL
  Filled 2020-10-13: qty 1

## 2020-10-13 MED ORDER — AMOXICILLIN 500 MG PO CAPS
500.0000 mg | ORAL_CAPSULE | Freq: Once | ORAL | Status: AC
  Administered 2020-10-13: 500 mg via ORAL
  Filled 2020-10-13: qty 1

## 2020-10-13 NOTE — Discharge Instructions (Signed)
1. Take antibiotic as prescribed (Amoxicillin). 2. Take pain medicines as needed (Motrin/Norco #15). 3. Return to the ER for worsening symptoms, persistent vomiting, fever, difficulty breathing or other concerns.

## 2020-10-13 NOTE — ED Triage Notes (Addendum)
Pt c/o of dental pin for the last 3 days, right lower rear molar appears dark with minimal swelling   Pt moaning and rocking in triage, pt denies taking meds within the last 24 hours

## 2020-10-13 NOTE — ED Provider Notes (Signed)
[facility] Emergency Department Provider Note   ____________________________________________   Event Date/Time   First MD Initiated Contact with Patient 10/13/20 0424     (approximate)  I have reviewed the triage vital signs and the nursing notes.   HISTORY  Chief Complaint Dental Pain    HPI Calvin Wu is a 35 y.o. male who presents to the ED from home with a chief complaint of dentalgia.  Patient has had a broken right molar for some time.  Complains of pain x3 days unrelieved by over-the-counter medications.  Denies fever, swelling, nausea/vomiting.     Past Medical History:  Diagnosis Date  . Neck pain     Patient Active Problem List   Diagnosis Date Noted  . Cellulitis 12/20/2016  . Cellulitis of arm, right     Past Surgical History:  Procedure Laterality Date  . FRACTURE SURGERY     L arm  . NECK SURGERY      Prior to Admission medications   Medication Sig Start Date End Date Taking? Authorizing Provider  amoxicillin (AMOXIL) 500 MG capsule Take 1 capsule (500 mg total) by mouth 3 (three) times daily. 10/13/20  Yes Irean Hong, MD  HYDROcodone-acetaminophen (NORCO) 5-325 MG tablet Take 1 tablet by mouth every 6 (six) hours as needed for moderate pain. 10/13/20  Yes Irean Hong, MD  ibuprofen (ADVIL) 600 MG tablet Take 1 tablet (600 mg total) by mouth every 8 (eight) hours as needed. 10/13/20  Yes Irean Hong, MD  SUBOXONE 8-2 MG FILM Place 1 Film under the tongue 2 (two) times daily. 12/19/16   [provider]    Allergies Sulfa antibiotics  History reviewed. No pertinent family history.  Social History Social History   Tobacco Use  . Smoking status: Current Every Day Smoker  . Smokeless tobacco: Never Used  Substance Use Topics  . Alcohol use: Yes  . 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     Review of  Systems  Constitutional: No fever/chills Eyes: No visual changes. ENT: Positive for dentalgia.  No sore throat. Cardiovascular: Denies chest pain. Respiratory: Denies shortness of breath. Gastrointestinal: No abdominal pain.  No nausea, no vomiting.  No diarrhea.  No constipation. Genitourinary: Negative for dysuria. Musculoskeletal: Negative for back pain. Skin: Negative for rash. Neurological: Negative for headaches, focal weakness or numbness.   ____________________________________________   PHYSICAL EXAM:  VITAL SIGNS: ED Triage Vitals  Enc Vitals Group     BP 10/13/20 0220 (!) 156/98     Pulse Rate 10/13/20 0220 73     Resp 10/13/20 0220 (!) 22     Temp 10/13/20 0220 98.2 F (36.8 C)     Temp Source 10/13/20 0220 Oral     SpO2 10/13/20 0220 99 %     Weight 10/13/20 0222 160 lb (72.6 kg)     Height 10/13/20 0222 5\' 4"  (1.626 m)     Head Circumference --      Peak Flow --      Pain Score 10/13/20 0221 10     Pain Loc --      Pain Edu? --      Excl. in GC? --     Constitutional: Alert and oriented. Well appearing and in no acute distress. Eyes: Conjunctivae are normal. PERRL. EOMI. Head: Atraumatic. Nose: No congestion/rhinnorhea. Mouth/Throat: Mucous membranes are moist.  Widespread dental decay noted.  Right lower molar with broken tooth which is tender to palpation with tongue blade.   Neck: No stridor.   Cardiovascular: Normal rate, regular rhythm. Grossly normal heart sounds.  Good peripheral circulation. Respiratory: Normal respiratory effort.  No retractions. Lungs CTAB. Gastrointestinal: Soft and nontender. No distention. No abdominal bruits. No CVA tenderness. Musculoskeletal: No lower extremity tenderness nor edema.  No joint effusions. Neurologic:  Normal speech and language. No gross focal neurologic deficits are appreciated. No gait instability. Skin:  Skin is warm, dry and intact. No rash noted. Psychiatric: Mood and affect are normal. Speech and  behavior are normal.  ____________________________________________   LABS (all labs ordered are listed, but only abnormal results are displayed)  Labs Reviewed - No data to display ____________________________________________  EKG  None ____________________________________________  RADIOLOGY I, Celeste Tavenner J, personally viewed and evaluated these images (plain radiographs) as part of my medical decision making, as well as reviewing the written report by the radiologist.  ED MD interpretation: None  Official radiology report(s): No results found.  ____________________________________________   PROCEDURES  Procedure(s) performed (including Critical Care):  Procedures   ____________________________________________   INITIAL IMPRESSION / ASSESSMENT AND PLAN / ED COURSE  As part of my medical decision making, I reviewed the following data within the electronic MEDICAL RECORD NUMBER Nursing notes reviewed and incorporated, Old chart reviewed, Notes from prior ED visits and [place] Controlled Substance Database     35 year old male presenting with dentalgia.  Will treat with amoxicillin, lidocaine, NSAIDs, analgesia.  Dental clinic sheet provided.  Strict return precautions given.  Patient verbalizes understanding agrees with plan of care.      ____________________________________________   FINAL CLINICAL IMPRESSION(S) / ED DIAGNOSES  Final diagnoses:  Pain, dental  Dental caries  Closed fracture of tooth, initial encounter     ED Discharge Orders         Ordered    ibuprofen (ADVIL) 600 MG tablet  Every 8 hours PRN        10/13/20 0429    amoxicillin (AMOXIL) 500 MG capsule  3 times daily        10/13/20 0429    HYDROcodone-acetaminophen (NORCO) 5-325 MG tablet  Every 6 hours PRN        10/13/20 0429          *Please note:  Calvin Wu was evaluated in Emergency Department on 10/13/2020 for the symptoms described in the history of present illness. He was  evaluated in the context of the global COVID-19 pandemic, which necessitated consideration that the patient might be at risk for infection with the SARS-CoV-2 virus that causes COVID-19. Institutional protocols and algorithms that pertain to the evaluation of patients at risk for COVID-19 are in a state of rapid change based on information released by regulatory bodies including the CDC and federal and state organizations. These policies and algorithms were followed during the patient's care in the ED.  Some ED evaluations and interventions may be delayed as a result of limited staffing during and the pandemic.*   Note:  This document was prepared using Dragon voice recognition software and may include unintentional dictation errors.   Irean Hong, MD 10/13/20 [phone]

## 2020-10-21 ENCOUNTER — Emergency Department
Admission: EM | Admit: 2020-10-21 | Discharge: 2020-10-21 | Disposition: A | Discharge status: Home / Self Care | Payer: Self-pay | Attending: Emergency Medicine | Admitting: Emergency Medicine

## 2020-10-21 ENCOUNTER — Other Ambulatory Visit: Payer: Self-pay

## 2020-10-21 ENCOUNTER — Emergency Department: Payer: Self-pay

## 2020-10-21 DIAGNOSIS — F17200 Nicotine dependence, unspecified, uncomplicated: Secondary | ICD-10-CM | POA: Insufficient documentation

## 2020-10-21 DIAGNOSIS — M79602 Pain in left arm: Secondary | ICD-10-CM | POA: Insufficient documentation

## 2020-10-21 LAB — CBC WITH DIFFERENTIAL/PLATELET
Abs Immature Granulocytes: 0.04 10*3/uL (ref 0.00–0.07)
Basophils Absolute: 0 10*3/uL (ref 0.0–0.1)
Basophils Relative: 0 %
Eosinophils Absolute: 0.4 10*3/uL (ref 0.0–0.5)
Eosinophils Relative: 4 %
HCT: 37.8 % — ABNORMAL LOW (ref 39.0–52.0)
Hemoglobin: 12.8 g/dL — ABNORMAL LOW (ref 13.0–17.0)
Immature Granulocytes: 0 %
Lymphocytes Relative: 22 %
Lymphs Abs: 2.2 10*3/uL (ref 0.7–4.0)
MCH: 30.8 pg (ref 26.0–34.0)
MCHC: 33.9 g/dL (ref 30.0–36.0)
MCV: 90.9 fL (ref 80.0–100.0)
Monocytes Absolute: 1 10*3/uL (ref 0.1–1.0)
Monocytes Relative: 10 %
Neutro Abs: 6.3 10*3/uL (ref 1.7–7.7)
Neutrophils Relative %: 64 %
Platelets: 234 10*3/uL (ref 150–400)
RBC: 4.16 MIL/uL — ABNORMAL LOW (ref 4.22–5.81)
RDW: 12.4 % (ref 11.5–15.5)
WBC: 9.8 10*3/uL (ref 4.0–10.5)
nRBC: 0 % (ref 0.0–0.2)

## 2020-10-21 LAB — BASIC METABOLIC PANEL
Anion gap: 8 (ref 5–15)
BUN: 14 mg/dL (ref 6–20)
CO2: 28 mmol/L (ref 22–32)
Calcium: 9.2 mg/dL (ref 8.9–10.3)
Chloride: 102 mmol/L (ref 98–111)
Creatinine, Ser: 0.72 mg/dL (ref 0.61–1.24)
GFR, Estimated: >60 mL/min (ref >60)
Glucose, Bld: 90 mg/dL (ref 70–99)
Potassium: 3.9 mmol/L (ref 3.5–5.1)
Sodium: 138 mmol/L (ref 135–145)

## 2020-10-21 MED ORDER — LIDOCAINE 5 % EX PTCH: 1.0000 | MEDICATED_PATCH | Freq: Two times a day (BID) | CUTANEOUS | 0 refills | Status: AC

## 2020-10-21 MED ORDER — LIDOCAINE 5 % EX PTCH
1.0000 | MEDICATED_PATCH | CUTANEOUS | Status: DC
  Administered 2020-10-21: 1 via TRANSDERMAL
  Filled 2020-10-21: qty 1

## 2020-10-21 MED ORDER — NAPROXEN 500 MG PO TABS: 500.0000 mg | ORAL_TABLET | Freq: Two times a day (BID) | ORAL | Status: DC

## 2020-10-21 MED ORDER — IBUPROFEN 600 MG PO TABS
600.0000 mg | ORAL_TABLET | Freq: Once | ORAL | Status: AC
  Administered 2020-10-21: 600 mg via ORAL
  Filled 2020-10-21: qty 1

## 2020-10-21 NOTE — ED Notes (Signed)
See triage note  Presents with left forearm pain for couple of days   Denies any fall or trauma  States he did help someone move   Prior to pain starting  Good pulses

## 2020-10-21 NOTE — ED Triage Notes (Signed)
Pt comes pov with left arm pain from elbow to fingers. States he broke it years ago and has plates and screws in it and has reinjured it over the past 3 days and pain has gotten worse.

## 2020-10-21 NOTE — Discharge Instructions (Signed)
Wear arm sling and take medication until evaluation by orthopedics.
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
## 2020-10-31 NOTE — Telephone Encounter (Cosign Needed)
Telephone entry

## 2020-11-07 ENCOUNTER — Emergency Department: Payer: No Typology Code available for payment source

## 2020-11-07 ENCOUNTER — Encounter: Payer: Self-pay | Admitting: Emergency Medicine

## 2020-11-07 ENCOUNTER — Emergency Department
Admission: EM | Admit: 2020-11-07 | Discharge: 2020-11-07 | Disposition: A | Discharge status: Home / Self Care | Payer: No Typology Code available for payment source | Attending: Emergency Medicine | Admitting: Emergency Medicine

## 2020-11-07 DIAGNOSIS — M542 Cervicalgia: Secondary | ICD-10-CM | POA: Insufficient documentation

## 2020-11-07 DIAGNOSIS — M79632 Pain in left forearm: Secondary | ICD-10-CM | POA: Insufficient documentation

## 2020-11-07 DIAGNOSIS — M79662 Pain in left lower leg: Secondary | ICD-10-CM | POA: Insufficient documentation

## 2020-11-07 DIAGNOSIS — M25561 Pain in right knee: Secondary | ICD-10-CM | POA: Diagnosis not present

## 2020-11-07 DIAGNOSIS — Y92410 Unspecified street and highway as the place of occurrence of the external cause: Secondary | ICD-10-CM | POA: Insufficient documentation

## 2020-11-07 DIAGNOSIS — F17200 Nicotine dependence, unspecified, uncomplicated: Secondary | ICD-10-CM | POA: Diagnosis not present

## 2020-11-07 MED ORDER — METHOCARBAMOL 500 MG PO TABS: 500.0000 mg | ORAL_TABLET | Freq: Three times a day (TID) | ORAL | 0 refills | Status: AC | PRN

## 2020-11-07 MED ORDER — MELOXICAM 15 MG PO TABS: 15.0000 mg | ORAL_TABLET | Freq: Every day | ORAL | 2 refills | Status: DC

## 2020-11-07 NOTE — Discharge Instructions (Signed)
Take Robaxin and Meloxicam as directed.  
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
## 2021-03-05 ENCOUNTER — Other Ambulatory Visit: Payer: Self-pay

## 2021-03-05 ENCOUNTER — Emergency Department: Payer: Self-pay

## 2021-03-05 ENCOUNTER — Emergency Department
Admission: EM | Admit: 2021-03-05 | Discharge: 2021-03-05 | Disposition: A | Discharge status: Home / Self Care | Payer: Self-pay | Attending: Emergency Medicine | Admitting: Emergency Medicine

## 2021-03-05 DIAGNOSIS — R109 Unspecified abdominal pain: Secondary | ICD-10-CM

## 2021-03-05 DIAGNOSIS — F17200 Nicotine dependence, unspecified, uncomplicated: Secondary | ICD-10-CM | POA: Insufficient documentation

## 2021-03-05 DIAGNOSIS — R519 Headache, unspecified: Secondary | ICD-10-CM | POA: Insufficient documentation

## 2021-03-05 DIAGNOSIS — N3090 Cystitis, unspecified without hematuria: Secondary | ICD-10-CM | POA: Insufficient documentation

## 2021-03-05 LAB — CBC
HCT: 37.6 % — ABNORMAL LOW (ref 39.0–52.0)
Hemoglobin: 13 g/dL (ref 13.0–17.0)
MCH: 31.3 pg (ref 26.0–34.0)
MCHC: 34.6 g/dL (ref 30.0–36.0)
MCV: 90.4 fL (ref 80.0–100.0)
Platelets: 251 10*3/uL (ref 150–400)
RBC: 4.16 MIL/uL — ABNORMAL LOW (ref 4.22–5.81)
RDW: 12.7 % (ref 11.5–15.5)
WBC: 16.6 10*3/uL — ABNORMAL HIGH (ref 4.0–10.5)
nRBC: 0 % (ref 0.0–0.2)

## 2021-03-05 LAB — URINALYSIS, COMPLETE (UACMP) WITH MICROSCOPIC
Bacteria, UA: NONE SEEN
Bilirubin Urine: NEGATIVE
Glucose, UA: NEGATIVE mg/dL
Hgb urine dipstick: NEGATIVE
Ketones, ur: NEGATIVE mg/dL
Leukocytes,Ua: NEGATIVE
Nitrite: NEGATIVE
Protein, ur: NEGATIVE mg/dL
Specific Gravity, Urine: 1.017 (ref 1.005–1.030)
Squamous Epithelial / HPF: NONE SEEN (ref 0–5)
pH: 7 (ref 5.0–8.0)

## 2021-03-05 LAB — COMPREHENSIVE METABOLIC PANEL
ALT: 22 U/L (ref 0–44)
AST: 24 U/L (ref 15–41)
Albumin: 3.4 g/dL — ABNORMAL LOW (ref 3.5–5.0)
Alkaline Phosphatase: 60 U/L (ref 38–126)
Anion gap: 7 (ref 5–15)
BUN: 15 mg/dL (ref 6–20)
CO2: 30 mmol/L (ref 22–32)
Calcium: 8.8 mg/dL — ABNORMAL LOW (ref 8.9–10.3)
Chloride: 104 mmol/L (ref 98–111)
Creatinine, Ser: 0.79 mg/dL (ref 0.61–1.24)
GFR, Estimated: >60 mL/min (ref >60)
Glucose, Bld: 77 mg/dL (ref 70–99)
Potassium: 4.8 mmol/L (ref 3.5–5.1)
Sodium: 141 mmol/L (ref 135–145)
Total Bilirubin: 0.4 mg/dL (ref 0.3–1.2)
Total Protein: 5.9 g/dL — ABNORMAL LOW (ref 6.5–8.1)

## 2021-03-05 LAB — LIPASE, BLOOD: Lipase: 31 U/L (ref 11–51)

## 2021-03-05 MED ORDER — CEPHALEXIN 500 MG PO CAPS: 500.0000 mg | ORAL_CAPSULE | Freq: Three times a day (TID) | ORAL | 0 refills | Status: AC

## 2021-03-05 MED ORDER — METOCLOPRAMIDE HCL 5 MG/ML IJ SOLN
10.0000 mg | Freq: Once | INTRAMUSCULAR | Status: AC
  Administered 2021-03-05: 10:00:00 10 mg via INTRAVENOUS
  Filled 2021-03-05: qty 2

## 2021-03-05 MED ORDER — KETOROLAC TROMETHAMINE 30 MG/ML IJ SOLN
30.0000 mg | Freq: Once | INTRAMUSCULAR | Status: AC
  Administered 2021-03-05: 09:00:00 30 mg via INTRAVENOUS
  Filled 2021-03-05: qty 1

## 2021-03-05 MED ORDER — CEPHALEXIN 500 MG PO CAPS
500.0000 mg | ORAL_CAPSULE | Freq: Once | ORAL | Status: AC
  Administered 2021-03-05: 13:00:00 500 mg via ORAL
  Filled 2021-03-05: qty 1

## 2021-03-05 MED ORDER — ONDANSETRON HCL 4 MG/2ML IJ SOLN
4.0000 mg | Freq: Once | INTRAMUSCULAR | Status: AC
  Administered 2021-03-05: 09:00:00 4 mg via INTRAVENOUS
  Filled 2021-03-05: qty 2

## 2021-03-05 MED ORDER — SODIUM CHLORIDE 0.9 % IV BOLUS
1000.0000 mL | Freq: Once | INTRAVENOUS | Status: AC
  Administered 2021-03-05: 08:00:00 1000 mL via INTRAVENOUS

## 2021-03-05 NOTE — Discharge Instructions (Addendum)
Please take your antibiotics as prescribed for their entire course.  Please use Tylenol or ibuprofen as needed for discomfort as written on the box.  Return to the emergency department for any fever, worsening pain, or any other symptom personally concerning to yourself.

## 2021-03-05 NOTE — ED Triage Notes (Signed)
Pt to ER via [facility] from work.   Pt reports waking up this morning with a headache, nausea, and centralized abdominal pain. Pt denies emesis/ diarrhea. Denies sick contacts or urinary symptoms. No abdominal distention./ pain with palpation.   Ems VSS- bp 131/74, O2 sats 100% RA, HR 65, cbg 123, EKG NSR.

## 2021-03-05 NOTE — ED Notes (Signed)
ED Provider at bedside. 

## 2021-03-05 NOTE — ED Provider Notes (Signed)
[facility] Emergency Department Provider Note  Time seen: 8:10 AM  I have reviewed the triage vital signs and the nursing notes.   HISTORY  Chief Complaint Abdominal Pain   HPI Calvin Wu is a 35 y.o. male with no significant past medical history presents to the emergency department with complaints of abdominal pain, nausea, headache starting this morning.  Patient denies any fever, dysuria, diarrhea or vomiting.  Denies any focal abdominal pain but states cramping across the mid abdomen.  Denies any cough or shortness of breath.   Past Medical History:  Diagnosis Date   Neck pain     Patient Active Problem List   Diagnosis Date Noted   Cellulitis 12/20/2016   Cellulitis of arm, right     Past Surgical History:  Procedure Laterality Date   FRACTURE SURGERY     L arm   NECK SURGERY      Prior to Admission medications   Medication Sig Start Date End Date Taking? Authorizing Provider  lidocaine (LIDODERM) 5 % Place 1 patch onto the skin every 12 (twelve) hours. Remove & Discard patch within 12 hours or as directed by MD 10/21/20 10/21/21  Joni Reining, PA-C  meloxicam (MOBIC) 15 MG tablet Take 1 tablet (15 mg total) by mouth daily. 11/07/20 11/07/21  Orvil Feil, PA-C  naproxen (NAPROSYN) 500 MG tablet Take 1 tablet (500 mg total) by mouth 2 (two) times daily with a meal. 10/21/20   Joni Reining, PA-C  SUBOXONE 8-2 MG FILM Place 1 Film under the tongue 2 (two) times daily. 12/19/16   [provider]    Allergies  Allergen Reactions   Sulfa Antibiotics Other (See Comments)    Uknown    No family history on file.  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     Review of  Systems Constitutional: Negative for fever. Cardiovascular: Negative for chest pain. Respiratory: Negative for shortness of breath. Gastrointestinal: Moderate diffuse abdominal pain/cramping.  Positive for nausea. Genitourinary: Negative for urinary compaints Musculoskeletal: Negative for musculoskeletal complaints Neurological: Positive for headache All other ROS negative  ____________________________________________   PHYSICAL EXAM:  VITAL SIGNS: ED Triage Vitals  Enc Vitals Group     BP 03/05/21 0755 (!) 111/59     Pulse Rate 03/05/21 0755 (!) 51     Resp 03/05/21 0755 18     Temp 03/05/21 0755 98.4 F (36.9 C)     Temp Source 03/05/21 0755 Oral     SpO2 03/05/21 0755 98 %     Weight 03/05/21 0756 150 lb (68 kg)     Height 03/05/21 0756 5\' 3"  (1.6 m)     Head Circumference --      Peak Flow --      Pain Score 03/05/21 0756 5     Pain Loc --      Pain Edu? --      Excl. in GC? --    Constitutional: Alert and oriented. Well appearing and in no distress. Eyes: Normal exam ENT      Head: Normocephalic and atraumatic.      Mouth/Throat: Mucous membranes are moist. Cardiovascular: Normal  rate, regular rhythm.  Respiratory: Normal respiratory effort without tachypnea nor retractions. Breath sounds are clear  Gastrointestinal: Soft and nontender. No distention. Musculoskeletal: Nontender with normal range of motion in all extremities. Neurologic:  Normal speech and language. No gross focal neurologic deficits Skin:  Skin is warm, dry and intact.  Psychiatric: Mood and affect are normal.  ____________________________________________    RADIOLOGY  IMPRESSION:  1. Diffuse thickening of the urinary bladder wall consistent with  cystitis.   2. There is hydronephrosis on each side, much more severe on the  left than on the right without obstructing lesion appreciable on  noncontrast enhanced study. Question hydronephrosis due to  impression on distal ureter secondary  to the urinary bladder wall  thickening. The degree of hydronephrosis on the left is felt to  warrant urologic consultation. Potentially, contrast enhanced CT  abdomen and pelvis or retrograde urography may be helpful to further  evaluate.   3. Sigmoid diverticula. Slight thickening of the wall of the mid to  distal sigmoid may be due to muscular hypertrophy from chronic  diverticulosis. Earliest changes of diverticulitis could present in  this manner. No associated soft tissue stranding.   4. Chronic calcification medial to the inferior left kidney, likely  due to prior inflammation. No associated fluid or mass in this area.   5. Mild gallbladder distension noted without appreciable wall  thickening. Ultrasound of the gallbladder to further evaluate may be  warranted.   6.  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   ____________________________________________   INITIAL IMPRESSION / ASSESSMENT AND PLAN / ED COURSE  Pertinent labs & imaging results that were available during my care of the patient were reviewed by me and considered in my medical decision making (see chart for details).   Patient presents emergency department for abdominal pain/cramping nausea and headache starting this morning.  Overall reassuring physical exam including an overall benign abdominal exam.  Vitals are reassuring.  We will check labs, treat with Toradol Zofran and fluids while awaiting lab results.  Patient agreeable to plan of care.  CT scan shows urinary bladder wall thickening patient's urinalysis however does not appear to show any significant abnormality.  No right upper quadrant tenderness on exam and normal LFTs.  Will cover with antibiotics as a precaution given the bladder findings.  Patient is to follow-up with his doctor.  I did discuss return precautions for any return of/worsening pain development of fever.  [name] was evaluated in Emergency Department on 03/05/2021 for the symptoms described in  the history of present illness. He was evaluated in the context of the global COVID-19 pandemic, which necessitated consideration that the patient might be at risk for infection with the SARS-CoV-2 virus that causes COVID-19. Institutional protocols and algorithms that pertain to the evaluation of patients at risk for COVID-19 are in a state of rapid change based on information released by regulatory bodies including the CDC and federal and state organizations. These policies and algorithms were followed during the patient's care in the ED.  ____________________________________________   FINAL CLINICAL IMPRESSION(S) / ED DIAGNOSES  Abdominal pain Cystitis   Minna Antis, MD 03/05/21 1220

## 2022-01-26 IMAGING — CR DG LUMBAR SPINE 2-3V
1 series · 2 of 2 positions shown · non-contrast
Comparison: CT 06/04/2009

CLINICAL DATA: Back pain

EXAM:
LUMBAR SPINE - 2-3 VIEW

[Series 1: dg lumbar spine 2-3 views · 0.14mm/px · 2 of 2 slices shown]
[im 1/2]
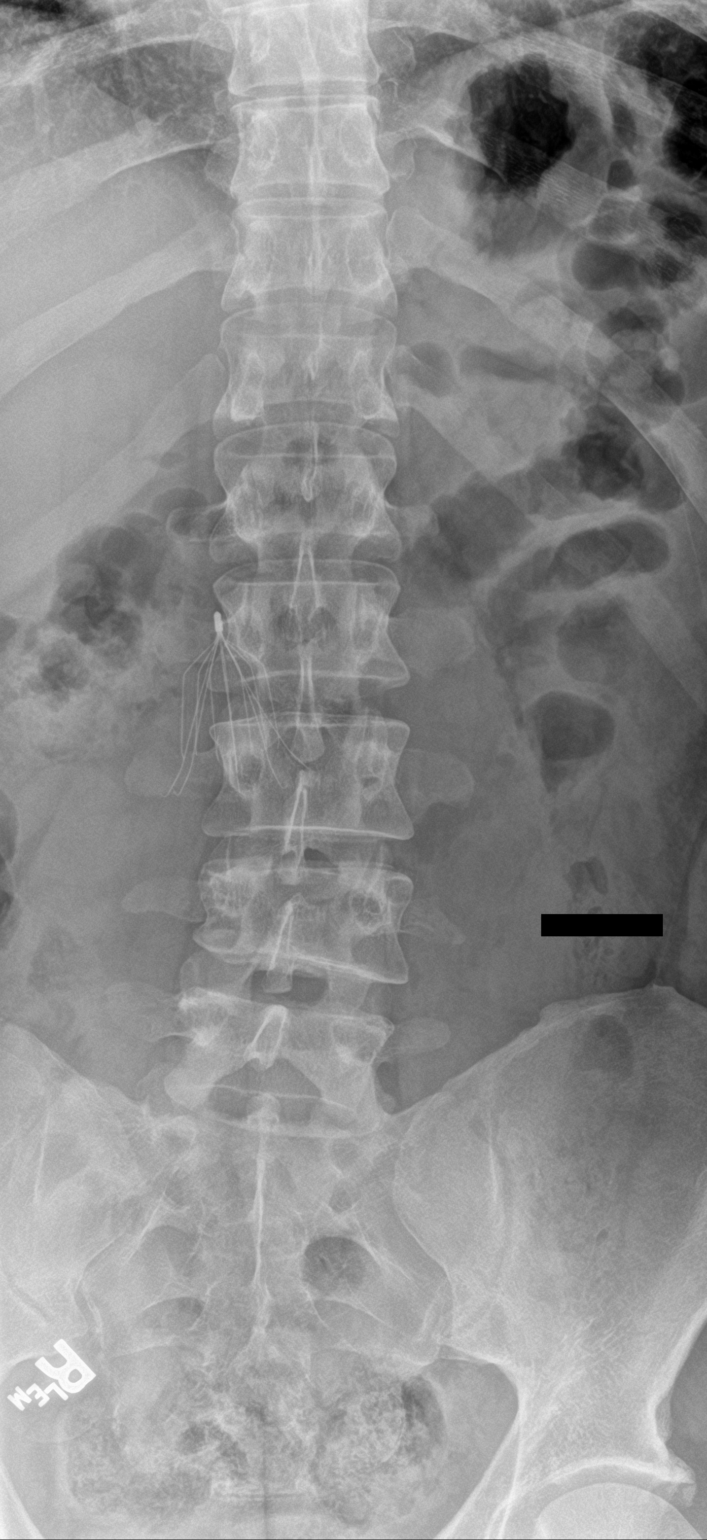
[im 2/2]
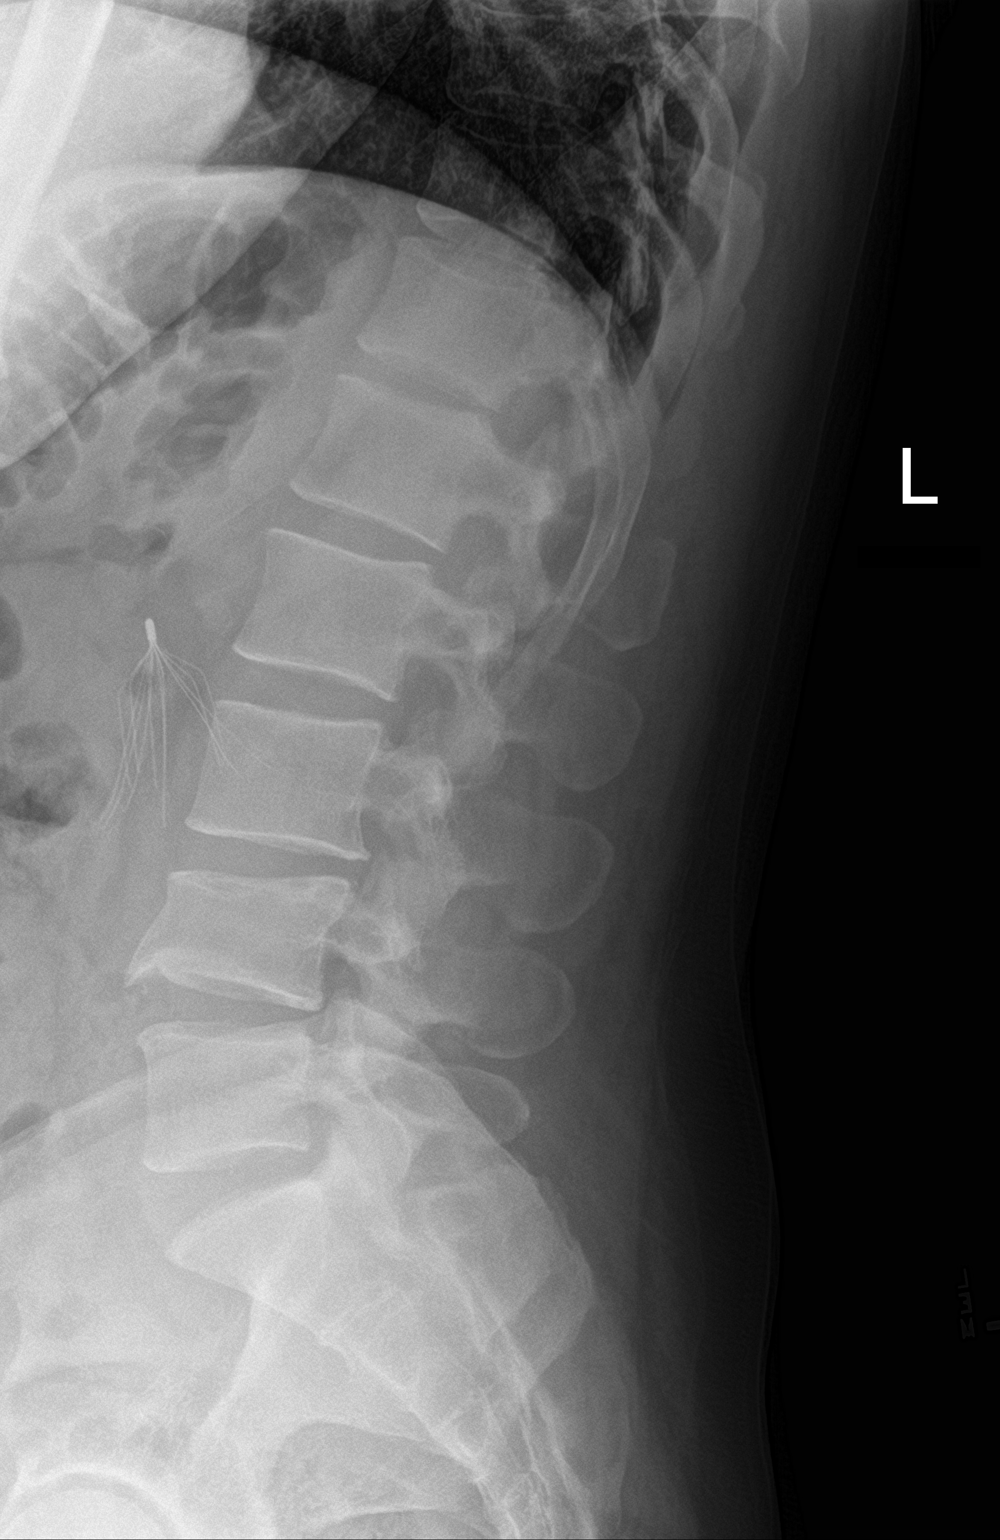

[2 of 2 positions shown; findings below may reference images not displayed]

FINDINGS: IVC filter to the right of L2 and L3. Mild levocurvature of the
lumbar spine. Sagittal alignment within normal limits. Mild
vertebral wedging at L4. Mild osteophyte at L4-L5.
IMPRESSION: Mild levocurvature of the lumbar spine with mild degenerative change
at L4-L5. Mild wedging and superior endplate deformity at L4 of
uncertain age.

## 2023-01-18 ENCOUNTER — Ambulatory Visit: Payer: Medicaid Other | Admitting: Internal Medicine

## 2023-01-20 ENCOUNTER — Ambulatory Visit: Payer: Medicaid Other | Admitting: Family

## 2023-01-25 IMAGING — CT CT ABD-PELV W/O CM
2 of 4 series · 15 of 46 positions shown, 17 images · non-contrast
Comparison: March 14, 2008 and June 04, 2009

CLINICAL DATA: Abdominal pain with nausea

EXAM:
CT ABDOMEN AND PELVIS WITHOUT CONTRAST
TECHNIQUE: Multidetector CT imaging of the abdomen and pelvis was performed
following the standard protocol without IV contrast.

[Series 2: routine abd/pel wo · axial · 0.81mm/px · z∈[-520,-130]mm · 12 of 94 slices shown, 14 images]
[im 8/94  soft-tissue]
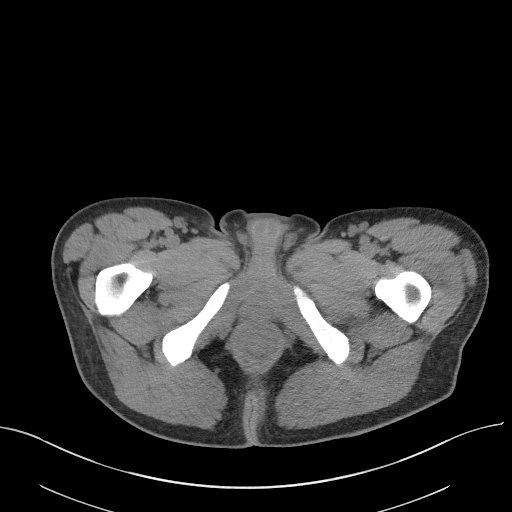
[im 8/94  bone]
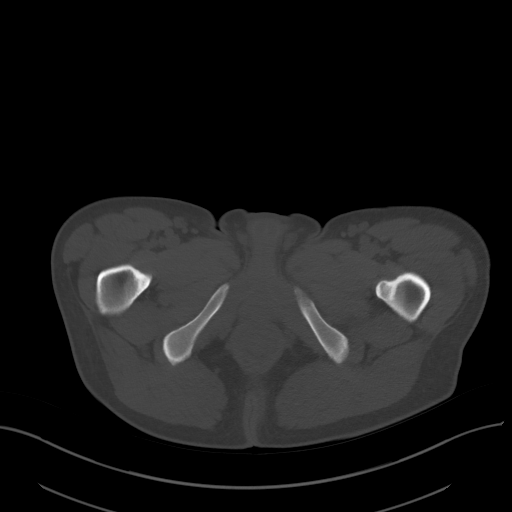
[im 15/94  soft-tissue]
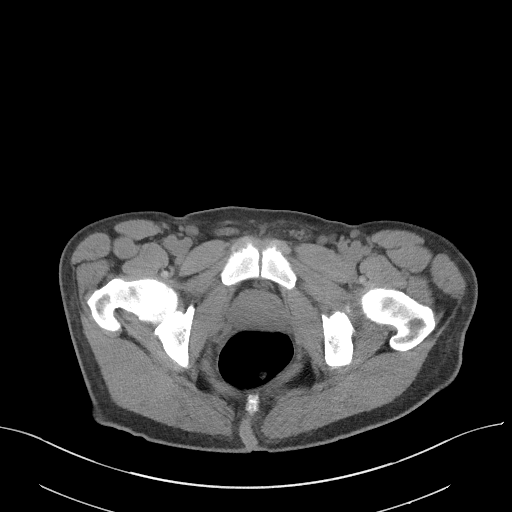
[im 22/94  soft-tissue]
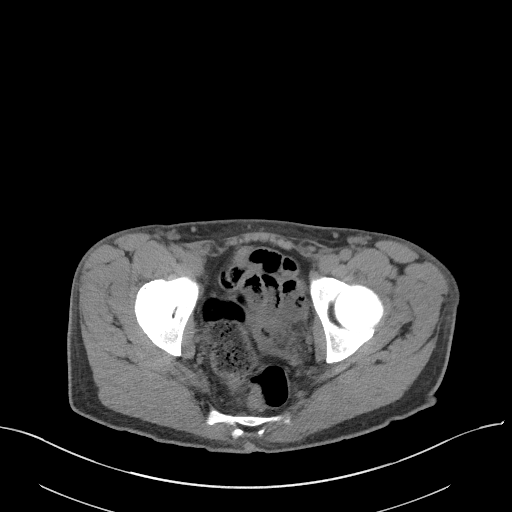
[im 29/94  soft-tissue]
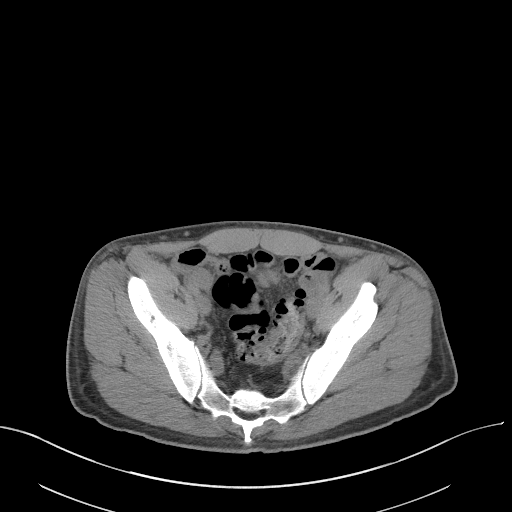
[im 36/94  soft-tissue]
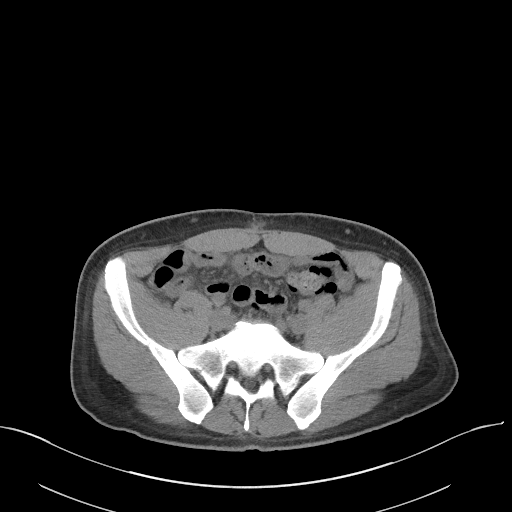
[im 43/94  soft-tissue]
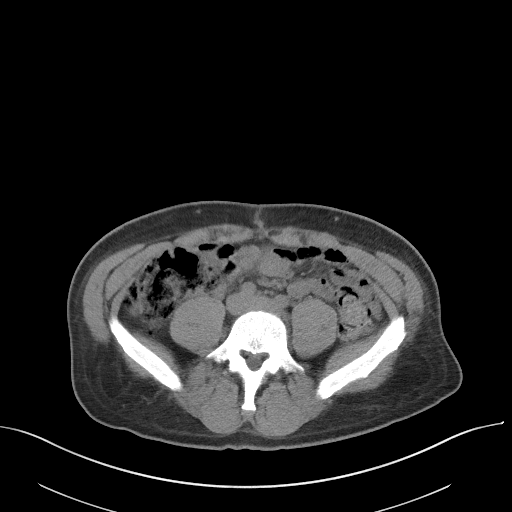
[im 51/94  soft-tissue]
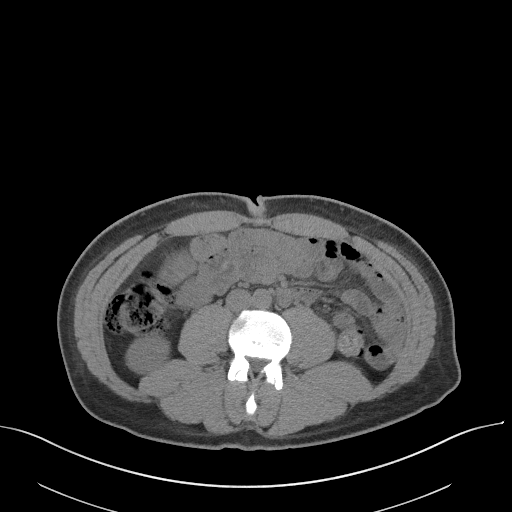
[im 58/94  soft-tissue]
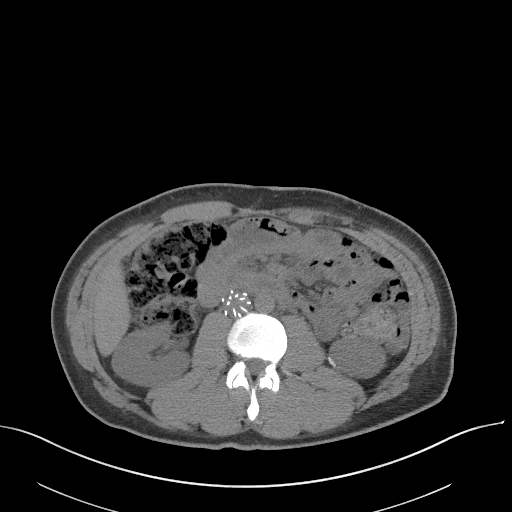
[im 65/94  soft-tissue]
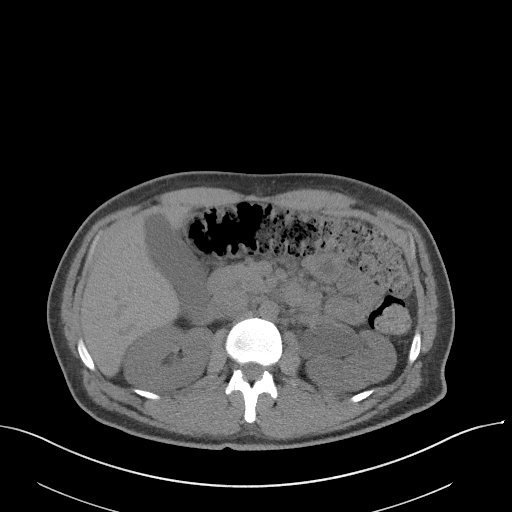
[im 65/94  bone]
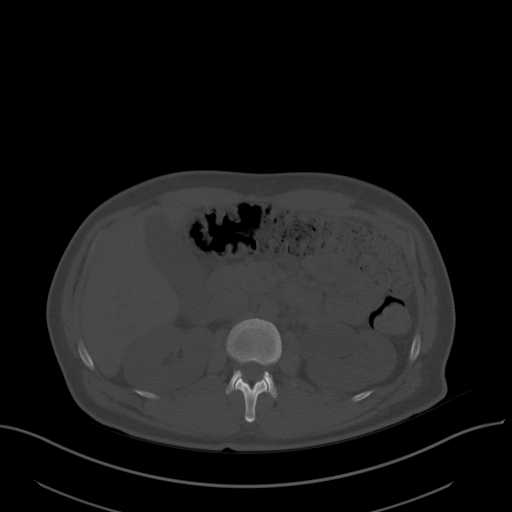
[im 72/94  soft-tissue]
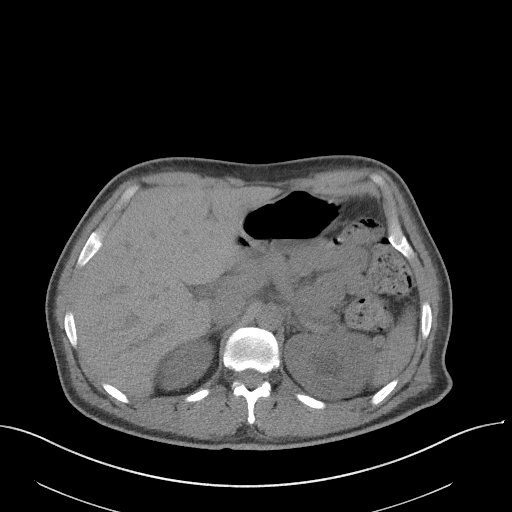
[im 79/94  soft-tissue]
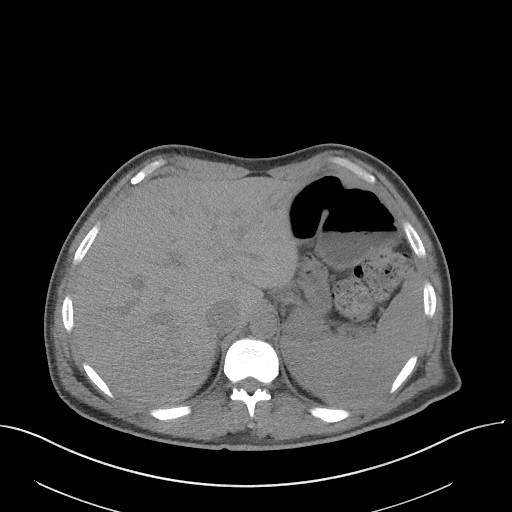
[im 86/94  soft-tissue]
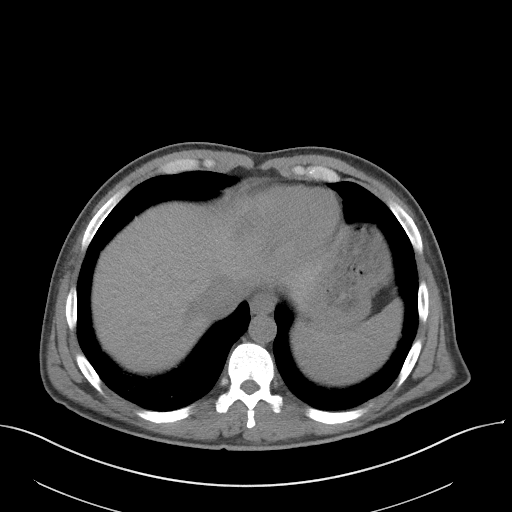

[Series 5: coronal st · coronal · 0.72mm/px · 3 of 86 slices shown]
[im 29/86  soft-tissue]
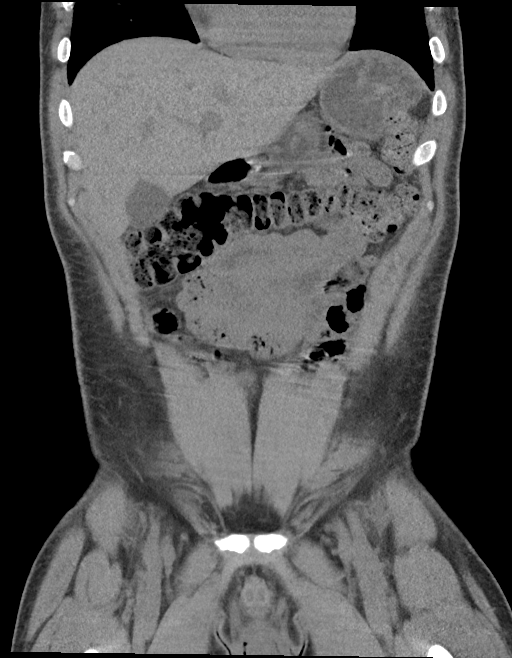
[im 38/86  soft-tissue]
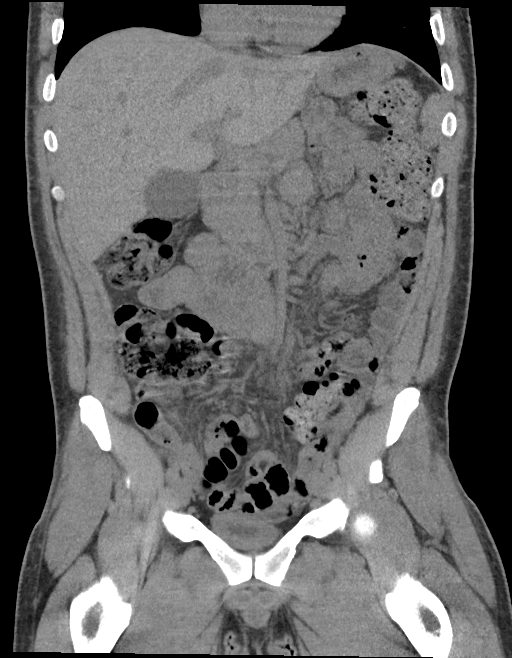
[im 48/86  soft-tissue]
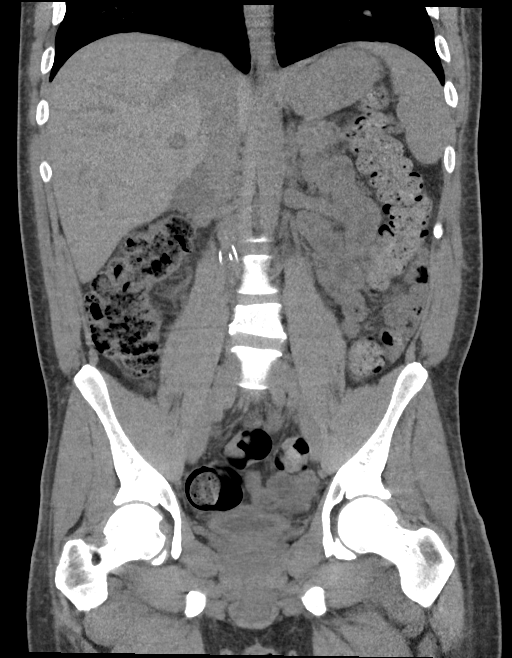

[15 of 46 positions shown; findings below may reference images not displayed]

FINDINGS: Lower chest: Lung bases are clear.

Hepatobiliary: No focal liver lesions are appreciable. Gallbladder
is mildly distended without appreciable gallbladder wall thickening.
There is no biliary duct dilatation.

Pancreas: There is no appreciable pancreatic mass or inflammatory
focus.

Spleen: Splenic lesions are evident.

Adrenals/Urinary Tract: Adrenals bilaterally appear normal. There is
severe hydronephrosis on the left mild hydronephrosis on the right.
No renal masses appreciable. There is calcification medial to left
kidney inferiorly, a finding that was present on prior CT
examinations. No associated fluid or mass in this area
calcification. There is no appreciable renal or ureteral calculus on
either side. Urinary bladder is diffusely thickened.

Stomach/Bowel: There are sigmoid diverticula. There is mild wall
thickening in the mid to distal sigmoid colon without soft tissue
stranding. No other evident bowel wall thickening. No evident bowel
obstruction. Terminal ileum appears unremarkable. Apparent
appendiceal absence. No periappendiceal region inflammation evident.
No free air or portal venous air.

Vascular/Lymphatic: No abdominal aortic aneurysm. There is a filter
in the inferior vena cava. No vascular lesions appreciable on this
noncontrast enhanced study. No evident adenopathy in the abdomen or
pelvis.

Reproductive: Prostate and seminal vesicles normal in size and
contour.

Other: No appreciable abscess or ascites in the abdomen or pelvis.

Musculoskeletal: No blastic or lytic bone lesions. Degenerative
change noted in the lower lumbar region. No abdominal wall or
intramuscular lesions.
IMPRESSION: 1. Diffuse thickening of the urinary bladder wall consistent with
cystitis.

2. There is hydronephrosis on each side, much more severe on the
left than on the right without obstructing lesion appreciable on
noncontrast enhanced study. Question hydronephrosis due to
impression on distal ureter secondary to the urinary bladder wall
thickening. The degree of hydronephrosis on the left is felt to
warrant urologic consultation. Potentially, contrast enhanced CT
abdomen and pelvis or retrograde urography may be helpful to further
evaluate.

3. Sigmoid diverticula. Slight thickening of the wall of the mid to
distal sigmoid may be due to muscular hypertrophy from chronic
diverticulosis. Earliest changes of diverticulitis could present in
this manner. No associated soft tissue stranding.

4. Chronic calcification medial to the inferior left kidney, likely
due to prior inflammation. No associated fluid or mass in this area.

5. Mild gallbladder distension noted without appreciable wall
thickening. Ultrasound of the gallbladder to further evaluate may be
warranted.

6.  Filter in inferior vena cava.

## 2023-02-01 ENCOUNTER — Ambulatory Visit: Payer: Medicaid Other | Admitting: Family

## 2023-02-01 VITALS — BP 122/68 | HR 68 | Ht 64.0 in | Wt 189.6 lb

## 2023-02-01 DIAGNOSIS — E782 Mixed hyperlipidemia: Secondary | ICD-10-CM | POA: Diagnosis not present

## 2023-02-01 DIAGNOSIS — E559 Vitamin D deficiency, unspecified: Secondary | ICD-10-CM

## 2023-02-01 DIAGNOSIS — I10 Essential (primary) hypertension: Secondary | ICD-10-CM | POA: Diagnosis not present

## 2023-02-01 DIAGNOSIS — R7303 Prediabetes: Secondary | ICD-10-CM

## 2023-02-01 DIAGNOSIS — E538 Deficiency of other specified B group vitamins: Secondary | ICD-10-CM

## 2023-02-01 DIAGNOSIS — R5383 Other fatigue: Secondary | ICD-10-CM

## 2023-02-01 NOTE — Progress Notes (Signed)
New Patient Office Visit  Subjective    Patient ID: Calvin Wu, male    DOB: July 02, 1986  Age: 37 y.o. MRN: [id number]  CC:  Chief Complaint  Patient presents with   Establish Care    New Patient    HPI Calvin Wu presents to establish care Previous Primary Care provider/office: n/a  He has not had a primary care for several years.   he does not have additional concerns to discuss today.     Outpatient Encounter Medications as of 02/01/2023  Medication Sig   cephALEXin (KEFLEX) 500 MG capsule Take 1 capsule (500 mg total) by mouth 3 (three) times daily.   SUBOXONE 8-2 MG FILM Place 1 Film under the tongue 2 (two) times daily.   No facility-administered encounter medications on file as of 02/01/2023.    Past Medical History:  Diagnosis Date   Neck pain     Past Surgical History:  Procedure Laterality Date   FRACTURE SURGERY     L arm   NECK SURGERY      No family history on file.  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    Sexual activity: Yes  Other Topics Concern   Not on file  Social History Narrative   Not on file   Social Determinants of Health   Financial Resource Strain: Not on file  Food Insecurity: Not on file  Transportation Needs: Not on file  Physical Activity: Not on file  Stress: Not on file  Social Connections: Not on file  Intimate Partner Violence: Not on file    Review of Systems  All other systems reviewed and are negative.       Objective    BP 122/68   Pulse 68    Ht 5\' 4"  (1.626 m)   Wt 189 lb 9.6 oz (86 kg)   SpO2 96%   BMI 32.54 kg/m   Physical Exam Vitals and nursing note reviewed.  Constitutional:      Appearance: Normal appearance. He is normal weight.  Eyes:     Pupils: Pupils are equal, round, and reactive to light.  Cardiovascular:     Rate and Rhythm: Normal rate and regular rhythm.     Pulses: Normal pulses.     Heart sounds: Normal heart sounds.  Pulmonary:     Effort: Pulmonary effort is normal.     Breath sounds: Normal breath sounds.  Neurological:     Mental Status: He is alert.  Psychiatric:        Mood and Affect: Mood normal.        Behavior: Behavior normal.        Assessment & Plan:   Problem List Items Addressed This Visit   None Visit Diagnoses     B12 deficiency due to diet    -  Primary   Relevant Orders   CBC With Differential (Completed)   CMP14+EGFR (Completed)  Vitamin B12 (Completed)   Essential hypertension, benign       Checking lab work today.  Blood pressure today is well controlled.   Relevant Orders   CBC With Differential (Completed)   CMP14+EGFR (Completed)   Mixed hyperlipidemia       Relevant Orders   Lipid panel (Completed)   CBC With Differential (Completed)   CMP14+EGFR (Completed)   Prediabetes       Relevant Orders   CBC With Differential (Completed)   CMP14+EGFR (Completed)   Hemoglobin A1c (Completed)   Vitamin D deficiency, unspecified       Relevant Orders   VITAMIN D 25 Hydroxy (Vit-D Deficiency, Fractures) (Completed)   CBC With Differential (Completed)   CMP14+EGFR (Completed)   Other fatigue       Relevant Orders   CBC With Differential (Completed)   CMP14+EGFR (Completed)   TSH (Completed)      Return in about 2 weeks (around 02/15/2023) for F/U.   Total time spent: 30 minutes  Miki Kins, FNP  02/01/2023  This document may have been prepared by [facility] Voice Recognition software and as such may include unintentional dictation errors.

## 2023-02-01 NOTE — Progress Notes (Signed)
Sleep Questionnaire: 3/10

## 2023-02-02 LAB — CMP14+EGFR
ALT: 32 IU/L (ref 0–44)
AST: 23 IU/L (ref 0–40)
Albumin/Globulin Ratio: 2.1 (ref 1.2–2.2)
Albumin: 3.5 g/dL — ABNORMAL LOW (ref 4.1–5.1)
Alkaline Phosphatase: 73 IU/L (ref 44–121)
BUN/Creatinine Ratio: 13 (ref 9–20)
BUN: 9 mg/dL (ref 6–20)
Bilirubin Total: <0.2 mg/dL (ref 0.0–1.2)
CO2: 24 mmol/L (ref 20–29)
Calcium: 9.1 mg/dL (ref 8.7–10.2)
Chloride: 105 mmol/L (ref 96–106)
Creatinine, Ser: 0.69 mg/dL — ABNORMAL LOW (ref 0.76–1.27)
Globulin, Total: 1.7 g/dL (ref 1.5–4.5)
Glucose: 81 mg/dL (ref 70–99)
Potassium: 4.2 mmol/L (ref 3.5–5.2)
Sodium: 141 mmol/L (ref 134–144)
Total Protein: 5.2 g/dL — ABNORMAL LOW (ref 6.0–8.5)
eGFR: 122 mL/min/{1.73_m2} (ref >59)

## 2023-02-02 LAB — LIPID PANEL
Chol/HDL Ratio: 4 ratio (ref 0.0–5.0)
Cholesterol, Total: 159 mg/dL (ref 100–199)
HDL: 40 mg/dL (ref >39)
LDL Chol Calc (NIH): 98 mg/dL (ref 0–99)
Triglycerides: 118 mg/dL (ref 0–149)
VLDL Cholesterol Cal: 21 mg/dL (ref 5–40)

## 2023-02-02 LAB — CBC WITH DIFFERENTIAL
Basophils Absolute: 0 10*3/uL (ref 0.0–0.2)
Basos: 1 %
EOS (ABSOLUTE): 0.4 10*3/uL (ref 0.0–0.4)
Eos: 5 %
Hematocrit: 41.7 % (ref 37.5–51.0)
Hemoglobin: 13.5 g/dL (ref 13.0–17.7)
Immature Grans (Abs): 0 10*3/uL (ref 0.0–0.1)
Immature Granulocytes: 0 %
Lymphocytes Absolute: 2.7 10*3/uL (ref 0.7–3.1)
Lymphs: 31 %
MCH: 29.9 pg (ref 26.6–33.0)
MCHC: 32.4 g/dL (ref 31.5–35.7)
MCV: 92 fL (ref 79–97)
Monocytes Absolute: 0.8 10*3/uL (ref 0.1–0.9)
Monocytes: 10 %
Neutrophils Absolute: 4.8 10*3/uL (ref 1.4–7.0)
Neutrophils: 53 %
RBC: 4.52 x10E6/uL (ref 4.14–5.80)
RDW: 13.7 % (ref 11.6–15.4)
WBC: 8.8 10*3/uL (ref 3.4–10.8)

## 2023-02-02 LAB — VITAMIN D 25 HYDROXY (VIT D DEFICIENCY, FRACTURES): Vit D, 25-Hydroxy: 17.2 ng/mL — ABNORMAL LOW (ref 30.0–100.0)

## 2023-02-02 LAB — HEMOGLOBIN A1C
Est. average glucose Bld gHb Est-mCnc: 103 mg/dL
Hgb A1c MFr Bld: 5.2 % (ref 4.8–5.6)

## 2023-02-02 LAB — VITAMIN B12: Vitamin B-12: 236 pg/mL (ref 232–1245)

## 2023-02-02 LAB — TSH: TSH: 1.73 u[IU]/mL (ref 0.450–4.500)

## 2023-02-15 ENCOUNTER — Ambulatory Visit: Payer: Medicaid Other | Admitting: Family

## 2023-02-16 ENCOUNTER — Encounter: Payer: Self-pay | Admitting: Family
